# Patient Record
Sex: Male | Born: 1965 | Race: White | Hispanic: No | Marital: Married | Smoking: Never smoker
Health system: Southern US, Community
[De-identification: ages and names within clinical notes are randomized; demographics above are authoritative.]

## PROBLEM LIST (undated history)

## (undated) DIAGNOSIS — Z87442 Personal history of urinary calculi: Secondary | ICD-10-CM

## (undated) DIAGNOSIS — I10 Essential (primary) hypertension: Secondary | ICD-10-CM

## (undated) DIAGNOSIS — K219 Gastro-esophageal reflux disease without esophagitis: Secondary | ICD-10-CM

## (undated) HISTORY — PX: TUMOR REMOVAL: SHX12

## (undated) HISTORY — PX: HAND SURGERY: SHX662

## (undated) HISTORY — PX: THROAT SURGERY: SHX803

---

## 2005-04-26 ENCOUNTER — Emergency Department: Payer: Self-pay | Admitting: Emergency Medicine

## 2007-08-26 ENCOUNTER — Ambulatory Visit: Payer: Self-pay | Admitting: Urology

## 2008-07-10 ENCOUNTER — Ambulatory Visit: Payer: Self-pay | Admitting: Internal Medicine

## 2009-01-13 IMAGING — US US RENAL KIDNEY
1 series · 14 of 25 positions shown · non-contrast
Comparison: none

REASON FOR EXAM: UTI, history of left flank pain
COMMENTS:

[Series 1: us renal kidney · 0.39mm/px · 14 of 30 slices shown]
[im 1/30]
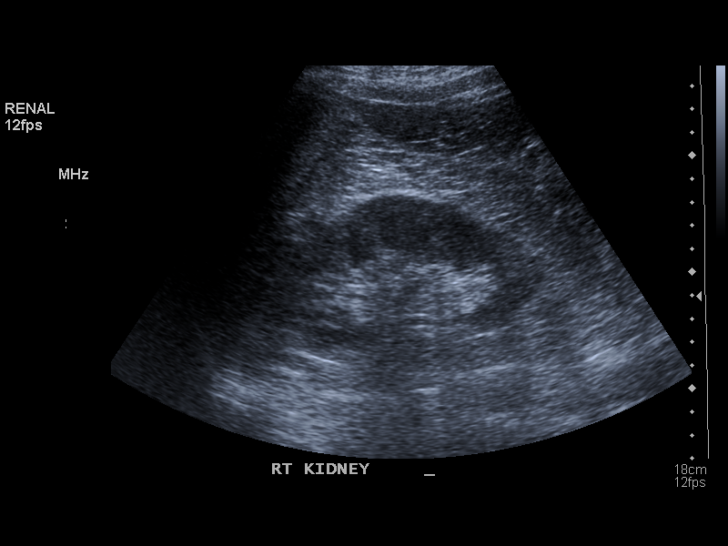
[im 3/30]
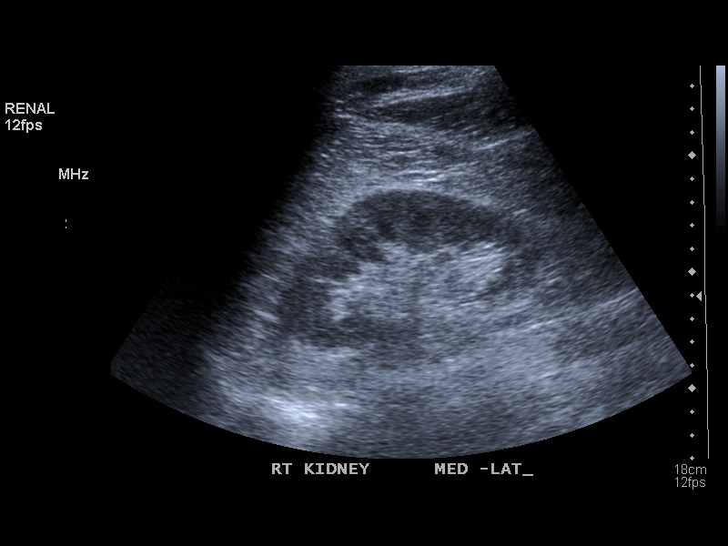
[im 5/30]
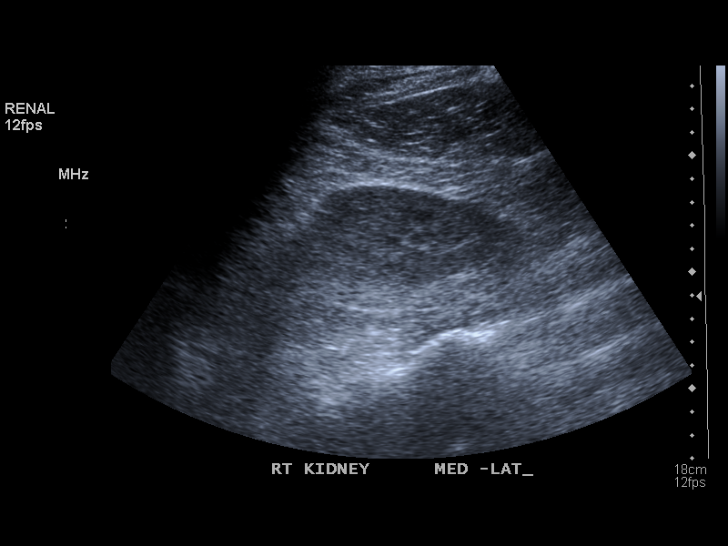
[im 8/30]
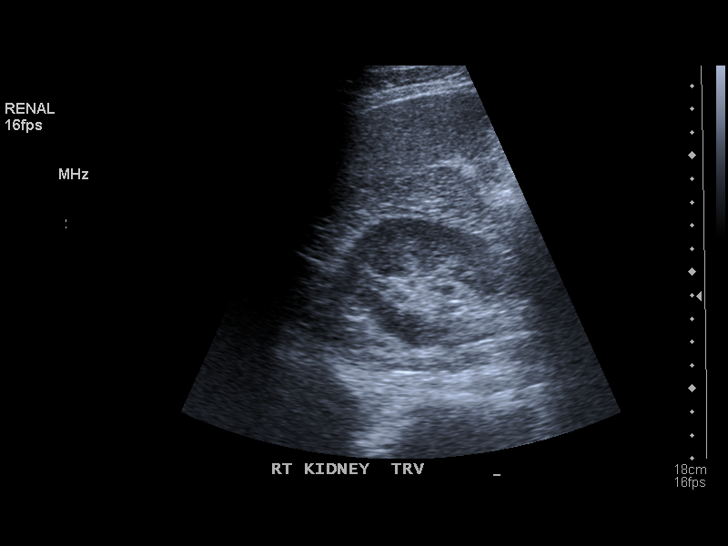
[im 10/30]
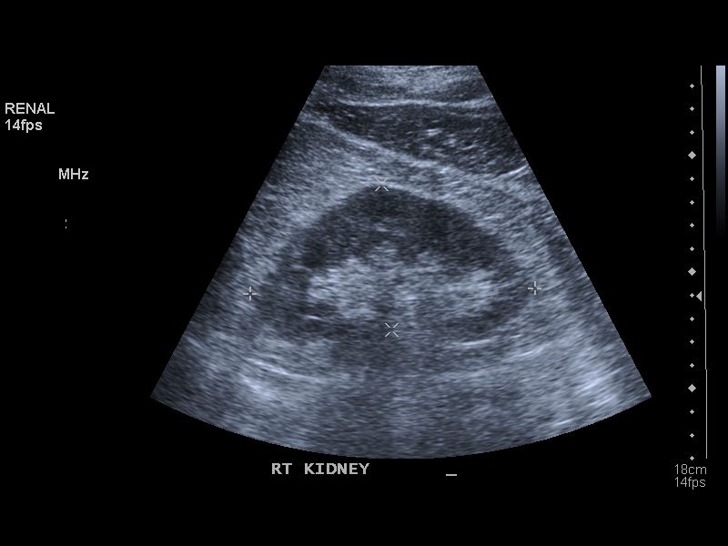
[im 11/30]
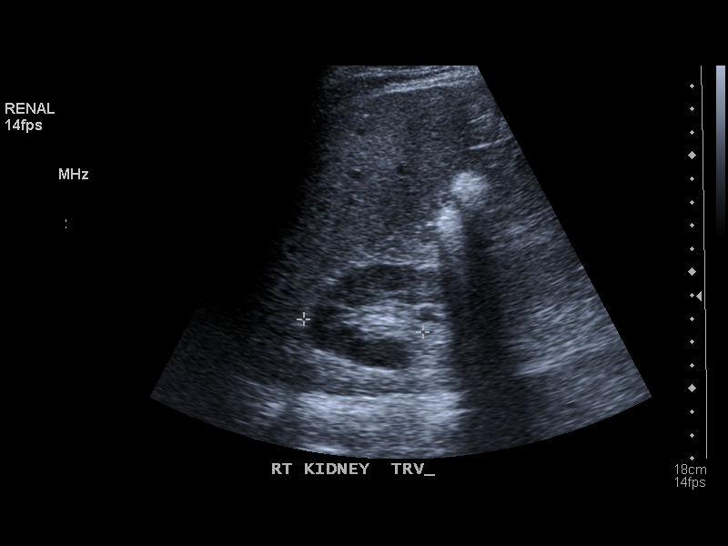
[im 14/30]
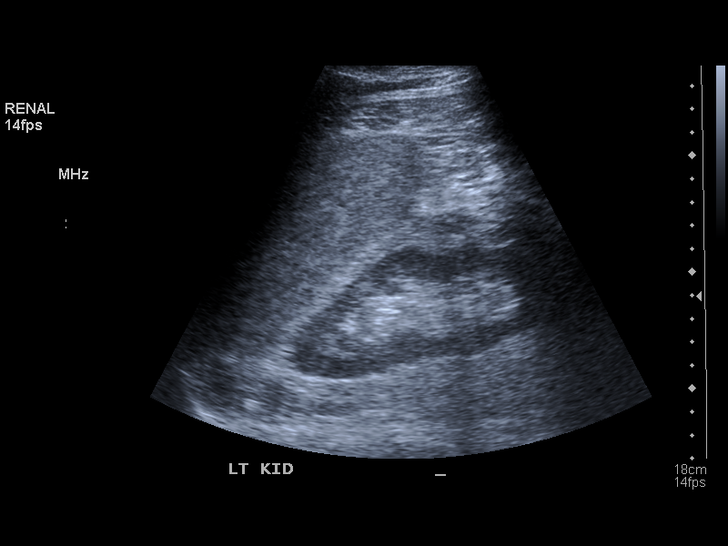
[im 16/30]
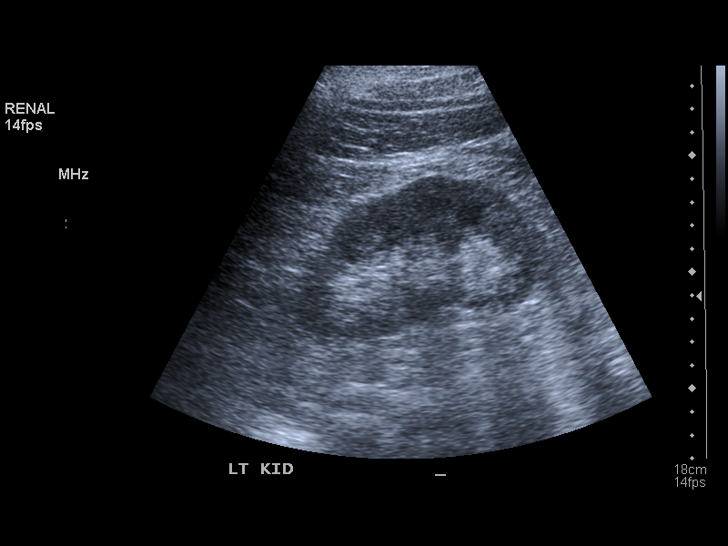
[im 19/30]
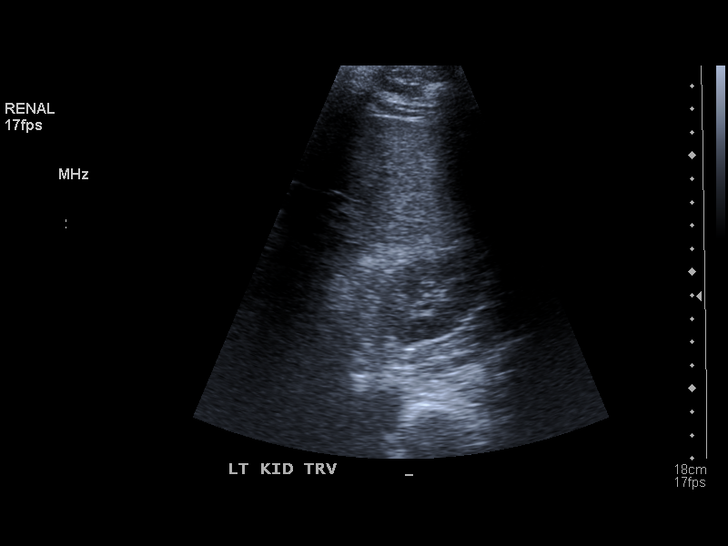
[im 20/30]
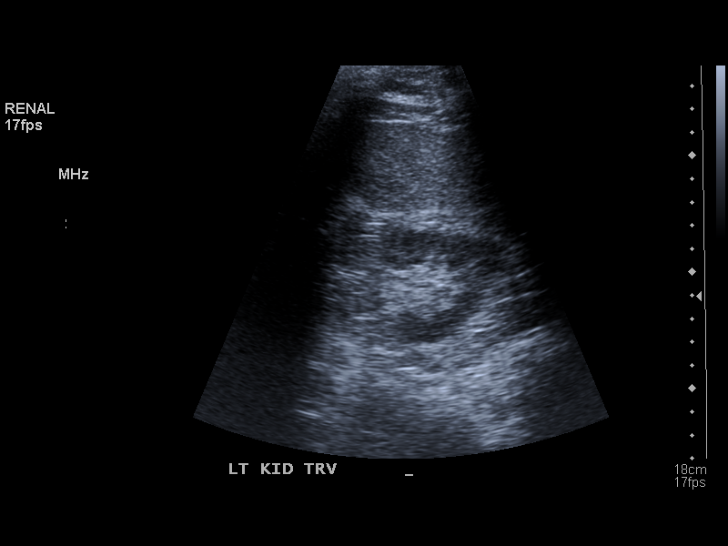
[im 22/30]
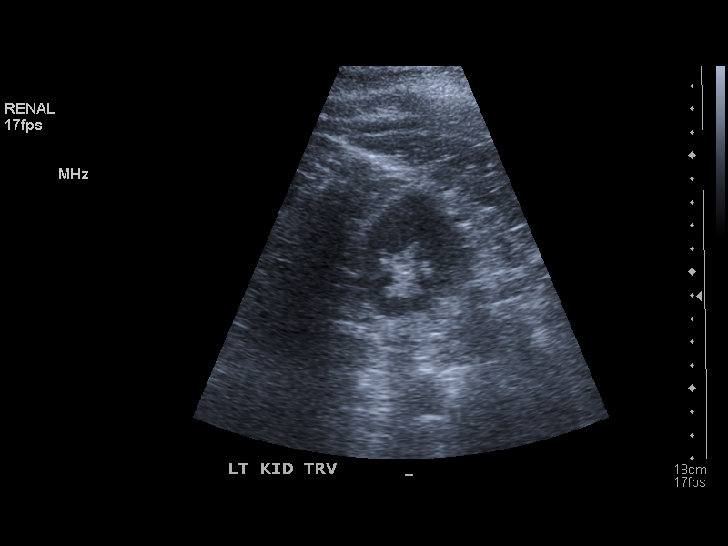
[im 25/30]
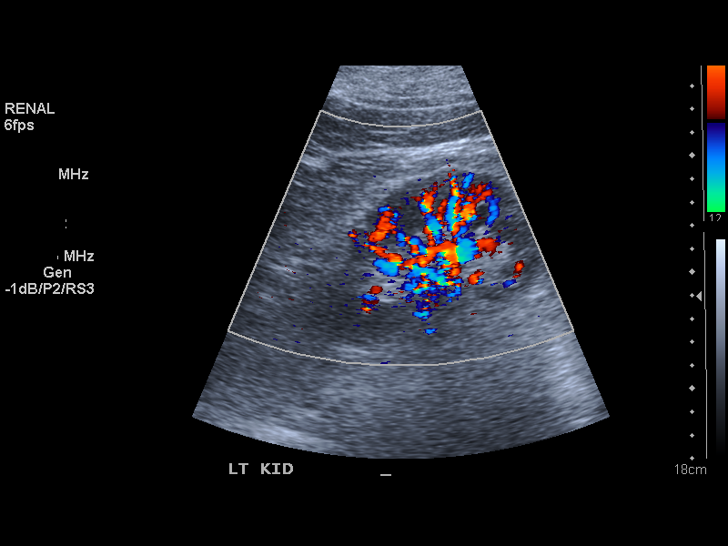
[im 27/30]
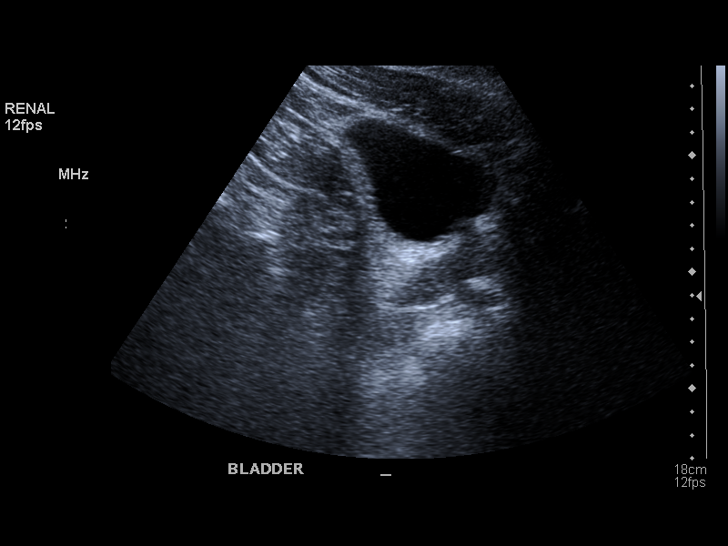
[im 30/30]
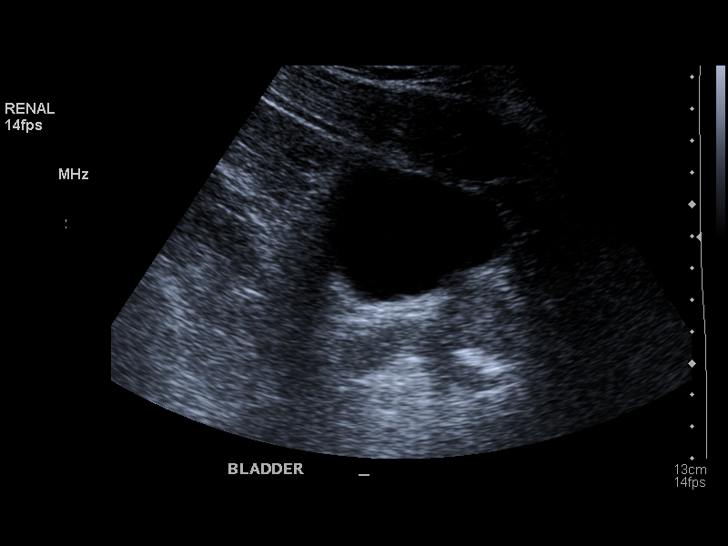

[14 of 25 positions shown; findings below may reference images not displayed]

PROCEDURE:     US  - US KIDNEY  - August 26, 2007 [DATE]

RESULT:     The RIGHT kidney measures 12.2 cm x 6.2 cm x 5.1 cm. The LEFT
kidney measures 11.97 cm x 5.6 cm x 5.18 cm. The renal cortical margins are
smooth. No renal mass lesions are seen. No renal calcifications are noted.
There is no hydronephrosis. The visualized portion of the urinary bladder is
normal in appearance.
IMPRESSION: No significant abnormalities are noted.

## 2010-02-13 ENCOUNTER — Ambulatory Visit: Payer: Self-pay | Admitting: Internal Medicine

## 2013-01-23 ENCOUNTER — Ambulatory Visit: Payer: Self-pay | Admitting: Unknown Physician Specialty

## 2016-07-13 NOTE — Patient Instructions (Signed)
  Your procedure is scheduled on: 07-20-16 (FRIDAY) Report to Same Day Surgery 2nd floor medical mall [facility](Medical Mall Entrance-take elevator on left to 2nd floor.  Check in with surgery information desk.) To find out your arrival time please call [phone] between 1PM - 3PM on 07-19-16 [facility](THURSDAY)  Remember: Instructions that are not followed completely may result in serious medical risk, up to and including death, or upon the discretion of your surgeon and anesthesiologist your surgery may need to be rescheduled.    _x___ 1. Do not eat food or drink liquids after midnight. No gum chewing or hard candies.     __x__ 2. No Alcohol for 24 hours before or after surgery.   __x__3. No Smoking for 24 prior to surgery.   ____  4. Bring all medications with you on the day of surgery if instructed.    __x__ 5. Notify your doctor if there is any change in your medical condition     (cold, fever, infections).     Do not wear jewelry, make-up, hairpins, clips or nail polish.  Do not wear lotions, powders, or perfumes. You may wear deodorant.  Do not shave 48 hours prior to surgery. Men may shave face and neck.  Do not bring valuables to the hospital.    [facility] not responsible for any belongings or valuables.               Contacts, dentures or bridgework may not be worn into surgery.  Leave your suitcase in the car. After surgery it may be brought to your room.  For patients admitted to the hospital, discharge time is determined by your treatment team.   Patients discharged the day of surgery will not be allowed to drive home.  You will need someone to drive you home and stay with you the night of your procedure.    Please read over the following fact sheets that you were given:   [facility] Preparing for Surgery and or MRSA Information   _x___ Take these medicines the morning of surgery with A SIP OF WATER:    1. DOXAZOSIN (CARDURA)  2. PRILOSEC  3.  4.  5.  6.  ____Fleets enema  or Magnesium Citrate as directed.   _x___ Use CHG Soap or sage wipes as directed on instruction sheet   ____ Use inhalers on the day of surgery and bring to hospital day of surgery  ____ Stop metformin 2 days prior to surgery    ____ Take 1/2 of usual insulin dose the night before surgery and none on the morning of  surgery.   ____ Stop Aspirin, Coumadin, Pllavix ,Eliquis, Effient, or Pradaxa  x__ Stop Anti-inflammatories such as Advil, Aleve, Ibuprofen, Motrin, Naproxen,          Naprosyn, Goodies powders or aspirin products NOW-Ok to take Tylenol.   _X___ Stop supplements until after surgery-PUMPKIN SEED OIL  ____ Bring C-Pap to the hospital.

## 2016-07-13 NOTE — Pre-Procedure Instructions (Signed)
Jerome Purl, MD - 06/28/2016 10:45 AM EST Formatting of this note may be different from the original. Jerome Arnold is a 51 y.o. male  CHIEF COMPLAINT: Chief Complaint  Patient presents with  . Hypertension   SUBJECTIVE: Patient going for hernia surgery, blood pressure elevated at 160/95, he rechecked it and is 140/85, 155/90 this morning. Overall asymptomatic, has not been working out and thinks that may be related. Compliant with his blood pressure medicine losartan. ______________________________________________________________________ Current Outpatient Prescriptions: acyclovir (ZOVIRAX) 400 MG tablet, Take 1 tablet (400 mg total) by mouth once daily., Taking cyclobenzaprine (FLEXERIL) 10 MG tablet, Take 10 mg by mouth once daily as needed for Muscle spasms., Taking doxazosin (CARDURA) 2 MG tablet, Take 2 mg by mouth once daily. EPINEPHrine (EPIPEN) 0.3 mg/0.3 mL pen injector, Inject 0.3 mg into the muscle once as needed for Anaphylaxis., Taking losartan-hydrochlorothiazide (HYZAAR) 100-12.5 mg tablet, Take 1 tablet by mouth once daily., Taking omeprazole (PRILOSEC) 20 MG DR capsule, Take 20 mg by mouth once daily., Taking  ALLERGIES: Guaifenesin and Nut - unspecified  Past Medical History:  Diagnosis Date  . Benign essential hypertension 07/22/2015  . 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 hand surgery Right  tendons repaired -- 8th grade  . Right knee tumor, benign  . throat surgery  3rd grade - sliced from a door and repaired  . TONSILLECTOMY   PHYSICAL EXAM:  BP (!) 142/100  Pulse 86  Ht 175.3 cm (5\' 9" )  Wt (!) 112.5 kg (248 lb)  SpO2 98%  BMI 36.62 kg/m Body mass index is 36.62 kg/m.  Wt Readings from Last 3 Encounters:  06/28/16 (!) 112.5 kg (248 lb)  06/20/16 (!) 113.4 kg (250 lb)  05/22/16 (!) 113.4 kg (250 lb)   BP Readings from Last 3 Encounters:  06/28/16 (!) 142/100  06/20/16  (!) 158/110  05/22/16 150/80   Constitutional:NAD Neck: supple, no thyromegaly, good ROM Respiratory:clear to auscultation, no rales or wheezes Cardiovascular:RRR, no murmur or gallop  ASSESSMENT/PLAN:  Uncontrolled hypertension-after he gets his hernia fixed he gets to working out he may not need the second blood pressure medicine but for now will add doxazosin 2 mg daily to the losartan, goal systolic less than 140, watch for orthostatic dizziness Right inguinal hernia- upcoming surgery 2/23 Mild cough-Claritin daily  No Follow-up on file.       Plan of Treatment - as of this encounter  Upcoming Encounters Upcoming Encounters  Date Type Specialty Care Team Description  07/16/2016 Ancillary Orders Lab Jerome Purl, MD  [address] [facility] [address]  [facility]  [place], [place] [address]  [phone]  [phone] (Fax)    07/23/2016 Office Visit Internal Medicine Jerome Purl, MD  [address] [facility] [address]  [facility]  [place], [place] [address]  [phone]  [phone] (Fax)    08/02/2016 Post Op General Surgery Mikel Cella., MD  [address] [place]  [place], [place] [address]  [phone]  [phone] (Fax)     Visit Diagnoses   Diagnosis  Benign essential hypertension - Primary  Essential hypertension, benign    Historical Medications - added in this encounter  This list may reflect changes made after this encounter.  Medication Sig. Disp. Refills Start Date End Date  doxazosin (CARDURA) 2 MG tablet  Take 2 mg by mouth once daily.       Images Document Information  Primary  Care Provider Jerome PurlMark Frederic Miller MD (Jan. 18, 2017 - Present) [phone] (Work) [phone] (Fax) [phone] [facility] [address] [facility] [place], [place] [address]  Document Coverage Dates Feb. 01, 2018  Custodian Organization [facility] [phone] (Work) [place], [place] [address]   Encounter Providers Jerome PurlMark Frederic Miller MD (Attending) [phone] (Work) [phone] (Fax) [phone] [facility] [address] [facility] [place], [place] [address]   Encounter Date Feb. 01, 2018

## 2016-07-16 ENCOUNTER — Encounter
Admission: RE | Admit: 2016-07-16 | Discharge: 2016-07-16 | Disposition: A | Discharge status: Home / Self Care | Payer: Managed Care, Other (non HMO) | Source: Ambulatory Visit | Attending: Surgery | Admitting: Surgery

## 2016-07-16 DIAGNOSIS — Z01810 Encounter for preprocedural cardiovascular examination: Secondary | ICD-10-CM | POA: Diagnosis present

## 2016-07-16 DIAGNOSIS — I10 Essential (primary) hypertension: Secondary | ICD-10-CM | POA: Diagnosis not present

## 2016-07-16 HISTORY — DX: Gastro-esophageal reflux disease without esophagitis: K21.9

## 2016-07-16 HISTORY — DX: Personal history of urinary calculi: Z87.442

## 2016-07-16 HISTORY — DX: Essential (primary) hypertension: I10

## 2016-07-19 MED ORDER — CEFAZOLIN SODIUM-DEXTROSE 2-4 GM/100ML-% IV SOLN
2.0000 g | Freq: Once | INTRAVENOUS | Status: AC
  Administered 2016-07-20: 2 g via INTRAVENOUS

## 2016-07-20 ENCOUNTER — Encounter
Admission: RE | Disposition: A | Discharge status: Home / Self Care | Payer: Self-pay | Source: Ambulatory Visit | Attending: Surgery

## 2016-07-20 ENCOUNTER — Ambulatory Visit
Admission: RE | Admit: 2016-07-20 | Discharge: 2016-07-20 | Disposition: A | Discharge status: Home / Self Care | Payer: Managed Care, Other (non HMO) | Source: Ambulatory Visit | Attending: Surgery | Admitting: Surgery

## 2016-07-20 ENCOUNTER — Ambulatory Visit: Payer: Managed Care, Other (non HMO) | Admitting: Anesthesiology

## 2016-07-20 ENCOUNTER — Encounter: Payer: Self-pay | Admitting: *Deleted

## 2016-07-20 DIAGNOSIS — K4090 Unilateral inguinal hernia, without obstruction or gangrene, not specified as recurrent: Secondary | ICD-10-CM | POA: Insufficient documentation

## 2016-07-20 DIAGNOSIS — I10 Essential (primary) hypertension: Secondary | ICD-10-CM | POA: Diagnosis not present

## 2016-07-20 DIAGNOSIS — Z87442 Personal history of urinary calculi: Secondary | ICD-10-CM | POA: Diagnosis not present

## 2016-07-20 DIAGNOSIS — Z888 Allergy status to other drugs, medicaments and biological substances status: Secondary | ICD-10-CM | POA: Diagnosis not present

## 2016-07-20 DIAGNOSIS — Z91010 Allergy to peanuts: Secondary | ICD-10-CM | POA: Insufficient documentation

## 2016-07-20 DIAGNOSIS — K219 Gastro-esophageal reflux disease without esophagitis: Secondary | ICD-10-CM | POA: Insufficient documentation

## 2016-07-20 HISTORY — PX: INGUINAL HERNIA REPAIR: SHX194

## 2016-07-20 SURGERY — REPAIR, HERNIA, INGUINAL, ADULT
Anesthesia: General | Site: Inguinal | Laterality: Left | Wound class: Clean

## 2016-07-20 MED ORDER — FENTANYL CITRATE (PF) 100 MCG/2ML IJ SOLN
25.0000 ug | INTRAMUSCULAR | Status: DC | PRN
  Administered 2016-07-20 (×2): 25 ug via INTRAVENOUS

## 2016-07-20 MED ORDER — LIDOCAINE HCL (CARDIAC) 20 MG/ML IV SOLN
INTRAVENOUS | Status: DC | PRN
  Administered 2016-07-20: 80 mg via INTRAVENOUS

## 2016-07-20 MED ORDER — LIDOCAINE HCL (PF) 2 % IJ SOLN
INTRAMUSCULAR | Status: AC
  Filled 2016-07-20: qty 2

## 2016-07-20 MED ORDER — KETOROLAC TROMETHAMINE 30 MG/ML IJ SOLN
INTRAMUSCULAR | Status: AC
  Filled 2016-07-20: qty 1

## 2016-07-20 MED ORDER — DEXAMETHASONE SODIUM PHOSPHATE 10 MG/ML IJ SOLN
INTRAMUSCULAR | Status: AC
  Filled 2016-07-20: qty 1

## 2016-07-20 MED ORDER — HYDROCODONE-ACETAMINOPHEN 5-325 MG PO TABS: 1.0000 | ORAL_TABLET | ORAL | 0 refills | Status: DC | PRN

## 2016-07-20 MED ORDER — PROMETHAZINE HCL 25 MG/ML IJ SOLN: 6.2500 mg | INTRAMUSCULAR | Status: DC | PRN

## 2016-07-20 MED ORDER — FENTANYL CITRATE (PF) 100 MCG/2ML IJ SOLN
INTRAMUSCULAR | Status: AC
  Administered 2016-07-20: 25 ug via INTRAVENOUS
  Filled 2016-07-20: qty 2

## 2016-07-20 MED ORDER — SUGAMMADEX SODIUM 200 MG/2ML IV SOLN
INTRAVENOUS | Status: DC | PRN
  Administered 2016-07-20: 200 mg via INTRAVENOUS

## 2016-07-20 MED ORDER — PROPOFOL 10 MG/ML IV BOLUS
INTRAVENOUS | Status: AC
  Filled 2016-07-20: qty 20

## 2016-07-20 MED ORDER — SUGAMMADEX SODIUM 200 MG/2ML IV SOLN
INTRAVENOUS | Status: AC
  Filled 2016-07-20: qty 2

## 2016-07-20 MED ORDER — ONDANSETRON HCL 4 MG/2ML IJ SOLN
INTRAMUSCULAR | Status: DC | PRN
  Administered 2016-07-20: 4 mg via INTRAVENOUS

## 2016-07-20 MED ORDER — FENTANYL CITRATE (PF) 100 MCG/2ML IJ SOLN
INTRAMUSCULAR | Status: AC
  Filled 2016-07-20: qty 2

## 2016-07-20 MED ORDER — FENTANYL CITRATE (PF) 100 MCG/2ML IJ SOLN
INTRAMUSCULAR | Status: DC | PRN
  Administered 2016-07-20: 100 ug via INTRAVENOUS

## 2016-07-20 MED ORDER — MIDAZOLAM HCL 2 MG/2ML IJ SOLN
INTRAMUSCULAR | Status: AC
  Filled 2016-07-20: qty 2

## 2016-07-20 MED ORDER — BUPIVACAINE-EPINEPHRINE (PF) 0.5% -1:200000 IJ SOLN
INTRAMUSCULAR | Status: DC | PRN
  Administered 2016-07-20: 17 mL via PERINEURAL

## 2016-07-20 MED ORDER — CEFAZOLIN SODIUM-DEXTROSE 2-4 GM/100ML-% IV SOLN
INTRAVENOUS | Status: AC
  Administered 2016-07-20: 2 g via INTRAVENOUS
  Filled 2016-07-20: qty 100

## 2016-07-20 MED ORDER — DEXAMETHASONE SODIUM PHOSPHATE 10 MG/ML IJ SOLN
INTRAMUSCULAR | Status: DC | PRN
  Administered 2016-07-20: 10 mg via INTRAVENOUS

## 2016-07-20 MED ORDER — PROPOFOL 10 MG/ML IV BOLUS
INTRAVENOUS | Status: DC | PRN
  Administered 2016-07-20: 200 mg via INTRAVENOUS

## 2016-07-20 MED ORDER — ROCURONIUM BROMIDE 100 MG/10ML IV SOLN
INTRAVENOUS | Status: DC | PRN
  Administered 2016-07-20: 50 mg via INTRAVENOUS
  Administered 2016-07-20: 20 mg via INTRAVENOUS

## 2016-07-20 MED ORDER — ONDANSETRON HCL 4 MG/2ML IJ SOLN
INTRAMUSCULAR | Status: AC
  Filled 2016-07-20: qty 2

## 2016-07-20 MED ORDER — ROCURONIUM BROMIDE 50 MG/5ML IV SOLN
INTRAVENOUS | Status: AC
  Filled 2016-07-20: qty 1

## 2016-07-20 MED ORDER — HYDROCODONE-ACETAMINOPHEN 5-325 MG PO TABS
1.0000 | ORAL_TABLET | ORAL | Status: DC | PRN
Start: 2016-07-20 — End: 2016-07-20

## 2016-07-20 MED ORDER — MIDAZOLAM HCL 2 MG/2ML IJ SOLN
INTRAMUSCULAR | Status: DC | PRN
  Administered 2016-07-20: 2 mg via INTRAVENOUS

## 2016-07-20 MED ORDER — LACTATED RINGERS IV SOLN
INTRAVENOUS | Status: DC
  Administered 2016-07-20: 08:00:00 via INTRAVENOUS

## 2016-07-20 MED ORDER — BUPIVACAINE-EPINEPHRINE (PF) 0.5% -1:200000 IJ SOLN
INTRAMUSCULAR | Status: AC
  Filled 2016-07-20: qty 30

## 2016-07-20 MED ORDER — KETOROLAC TROMETHAMINE 30 MG/ML IJ SOLN
INTRAMUSCULAR | Status: DC | PRN
  Administered 2016-07-20: 30 mg via INTRAVENOUS

## 2016-07-20 SURGICAL SUPPLY — 29 items
BLADE CLIPPER SURG (BLADE) ×3 IMPLANT
BLADE SURG 15 STRL LF DISP TIS (BLADE) ×1 IMPLANT
BLADE SURG 15 STRL SS (BLADE) ×2
CANISTER SUCT 1200ML W/VALVE (MISCELLANEOUS) ×3 IMPLANT
CHLORAPREP W/TINT 26ML (MISCELLANEOUS) ×3 IMPLANT
DERMABOND ADVANCED (GAUZE/BANDAGES/DRESSINGS) ×2
DERMABOND ADVANCED .7 DNX12 (GAUZE/BANDAGES/DRESSINGS) ×1 IMPLANT
DRAIN PENROSE 5/8X18 LTX STRL (WOUND CARE) ×3 IMPLANT
DRAPE LAPAROTOMY 77X122 PED (DRAPES) ×3 IMPLANT
ELECT REM PT RETURN 9FT ADLT (ELECTROSURGICAL) ×3
ELECTRODE REM PT RTRN 9FT ADLT (ELECTROSURGICAL) ×1 IMPLANT
GLOVE BIO SURGEON STRL SZ7.5 (GLOVE) ×12 IMPLANT
GLOVE BIOGEL PI IND STRL 7.5 (GLOVE) ×5 IMPLANT
GLOVE BIOGEL PI INDICATOR 7.5 (GLOVE) ×10
GOWN STRL REUS W/ TWL LRG LVL3 (GOWN DISPOSABLE) ×4 IMPLANT
GOWN STRL REUS W/TWL LRG LVL3 (GOWN DISPOSABLE) ×8
KIT RM TURNOVER STRD PROC AR (KITS) ×3 IMPLANT
LABEL OR SOLS (LABEL) ×3 IMPLANT
MESH SYNTHETIC 4X6 SOFT BARD (Mesh General) ×1 IMPLANT
MESH SYNTHETIC SOFT BARD 4X6 (Mesh General) ×2 IMPLANT
NEEDLE HYPO 25X1 1.5 SAFETY (NEEDLE) ×3 IMPLANT
NS IRRIG 500ML POUR BTL (IV SOLUTION) ×3 IMPLANT
PACK BASIN MINOR ARMC (MISCELLANEOUS) ×3 IMPLANT
SUT CHROMIC 4 0 RB 1X27 (SUTURE) ×3 IMPLANT
SUT MNCRL AB 4-0 PS2 18 (SUTURE) ×3 IMPLANT
SUT SURGILON 0 30 BLK (SUTURE) ×9 IMPLANT
SUT VIC AB 4-0 SH 27 (SUTURE) ×2
SUT VIC AB 4-0 SH 27XANBCTRL (SUTURE) ×1 IMPLANT
SYRINGE 10CC LL (SYRINGE) ×3 IMPLANT

## 2016-07-20 NOTE — Anesthesia Postprocedure Evaluation (Signed)
Anesthesia Post Note  Patient: Jerome Arnold  Procedure(s) Performed: Procedure(s) (LRB): HERNIA REPAIR INGUINAL ADULT (Left)  Patient location during evaluation: PACU Anesthesia Type: General Level of consciousness: awake and alert Pain management: pain level controlled Vital Signs Assessment: post-procedure vital signs reviewed and stable Respiratory status: spontaneous breathing, nonlabored ventilation, respiratory function stable and patient connected to nasal cannula oxygen Cardiovascular status: blood pressure returned to baseline and stable Postop Assessment: no signs of nausea or vomiting Anesthetic complications: no     Last Vitals:  Vitals:   07/20/16 1139 07/20/16 1148  BP: (!) 115/59 (!) 101/56  Pulse: 62 (!) 52  Resp: 14   Temp: 36.8 C (!) 35.9 C    Last Pain:  Vitals:   07/20/16 1148  TempSrc: Temporal  PainSc:                  Lenard SimmerAndrew Lariya Kinzie

## 2016-07-20 NOTE — OR Nursing (Signed)
Upon arrival to post-op, pt c/o feeling like he needed to have a bowel movement, transferred to bedside commode.  Also C/O nausea, 1st bag of IVF up 300 ml left to infuse, turned wide open.

## 2016-07-20 NOTE — OR Nursing (Signed)
Dr. Katrinka BlazingSmith into see pt and wife, ok to dc home.

## 2016-07-20 NOTE — Op Note (Signed)
OPERATIVE REPORT  PREOPERATIVE DIAGNOSIS: left inguinal hernia  POSTOPERATIVE DIAGNOSIS:left  inguinal hernia  PROCEDURE:  left inguinal hernia repair  ANESTHESIA:  General  SURGEON:  Renda RollsWilton Symone Cornman M.D.  INDICATIONS: He reports bulging in the left groin with moderate discomfort. A left inguinal hernia was demonstrated on physical exam and repair was recommended for definitive treatment.  With the patient on the operating table in the supine position the left lower quadrant was prepared with clippers and with ChloraPrep and draped in a sterile manner. A transversely oriented suprapubic incision was made and carried down through subcutaneous tissues. Electrocautery was used for hemostasis. The Scarpa's fascia was incised. The external oblique aponeurosis was incised along the course of its fibers to open the external ring and expose the inguinal cord structures. The cord structures were mobilized. A Penrose drain was passed around the cord structures for traction. The floor of the inguinal canal was intact. Cremaster fibers were separated to expose the herniated tissues consisting of sigmoid colon and omentum. Dissection was carried out so that the colon could be put back into the abdomen. A portion of the omentum was ligated with 0 Surgilon and amputated and was not submitted for pathology. The repair was carried out with 0 Surgilon sutures suturing the conjoined tendon to the shelving edge of the inguinal ligament incorporating transversalis fascia into the repair. The last stitch led to satisfactory narrowing of the internal ring.  Bard soft mesh was cut to create an oval shape and was placed over the repair. This was sutured to the repair with interrupted 0 Surgilon sutures and also sutured medially to the deep fascia and on both sides of the internal ring. Next after seeing hemostasis was intact the cord structures were replaced along the floor of the inguinal canal. The cut edges of the external  oblique aponeurosis were closed with a running 4-0 Vicryl suture to re-create the external ring. The deep fascia superior and lateral to the repair site was infiltrated with half percent Sensorcaine with epinephrine. Subcutaneous tissues were also infiltrated. The Scarpa's fascia was closed with interrupted 4-0 Vicryl sutures. The skin was closed with running 4-0 Monocryl subcuticular suture and Dermabond. The testicle remained in the scrotum  The patient appeared to be in satisfactory condition and was prepared for transfer to the recovery room.  Renda RollsWilton Jerret Mcbane M.D.

## 2016-07-20 NOTE — Anesthesia Post-op Follow-up Note (Cosign Needed)
Anesthesia QCDR form completed.        

## 2016-07-20 NOTE — Anesthesia Preprocedure Evaluation (Signed)
Anesthesia Evaluation  Patient identified by MRN, date of birth, ID band Patient awake    Reviewed: Allergy & Precautions, H&P , NPO status , Patient's Chart, lab work & pertinent test results, reviewed documented beta blocker date and time   History of Anesthesia Complications Negative for: history of anesthetic complications  Airway Mallampati: II  TM Distance: >3 FB Neck ROM: full    Dental  (+) Teeth Intact   Pulmonary neg pulmonary ROS,           Cardiovascular Exercise Tolerance: Good hypertension, (-) angina(-) CAD, (-) Past MI, (-) Cardiac Stents and (-) CABG (-) dysrhythmias (-) Valvular Problems/Murmurs     Neuro/Psych negative neurological ROS  negative psych ROS   GI/Hepatic Neg liver ROS, GERD  Medicated,  Endo/Other  negative endocrine ROS  Renal/GU Renal disease (kidney stones)  negative genitourinary   Musculoskeletal   Abdominal   Peds  Hematology negative hematology ROS (+)   Anesthesia Other Findings Past Medical History: No date: GERD (gastroesophageal reflux disease) No date: History of kidney stones No date: Hypertension   Reproductive/Obstetrics negative OB ROS                             Anesthesia Physical Anesthesia Plan  ASA: II  Anesthesia Plan: General   Post-op Pain Management:    Induction:   Airway Management Planned:   Additional Equipment:   Intra-op Plan:   Post-operative Plan:   Informed Consent: I have reviewed the patients History and Physical, chart, labs and discussed the procedure including the risks, benefits and alternatives for the proposed anesthesia with the patient or authorized representative who has indicated his/her understanding and acceptance.   Dental Advisory Given  Plan Discussed with: Anesthesiologist, CRNA and Surgeon  Anesthesia Plan Comments:         Anesthesia Quick Evaluation

## 2016-07-20 NOTE — H&P (Signed)
  He comes in today prepared for left inguinal hernia repair.  He reports no change in condition since the office visit.  The left side was marked YES. I discussed the plan for

## 2016-07-20 NOTE — Transfer of Care (Signed)
Immediate Anesthesia Transfer of Care Note  Patient: Jerome Arnold  Procedure(s) Performed: Procedure(s): HERNIA REPAIR INGUINAL ADULT (Left)  Patient Location: PACU  Anesthesia Type:General  Level of Consciousness: sedated  Airway & Oxygen Therapy: Patient Spontanous Breathing  Post-op Assessment: Report given to RN and Post -op Vital signs reviewed and stable  Post vital signs: Reviewed and stable  Last Vitals:  Vitals:   07/20/16 0811 07/20/16 1044  BP: (!) 150/87 (!) 151/89  Pulse: 67 79  Resp: 20 18  Temp: (!) 36.1 C 36.8 C    Last Pain:  Vitals:   07/20/16 0811  TempSrc: Tympanic  PainSc: 0-No pain      Patients Stated Pain Goal: 2 (07/20/16 16100811)  Complications: No apparent anesthesia complications

## 2016-07-20 NOTE — Anesthesia Procedure Notes (Signed)
Procedure Name: Intubation Performed by: Letitia Neri Pre-anesthesia Checklist: Patient identified, Emergency Drugs available, Suction available, Patient being monitored and Timeout performed Patient Re-evaluated:Patient Re-evaluated prior to inductionOxygen Delivery Method: Circle system utilized Preoxygenation: Pre-oxygenation with 100% oxygen Intubation Type: IV induction Ventilation: Mask ventilation without difficulty Laryngoscope Size: Mac and 3 Grade View: Grade I Tube type: Oral Tube size: 7.0 mm Number of attempts: 1 Airway Equipment and Method: Stylet Placement Confirmation: ETT inserted through vocal cords under direct vision,  positive ETCO2 and breath sounds checked- equal and bilateral Secured at: 22 cm Tube secured with: Tape

## 2016-07-20 NOTE — Discharge Instructions (Addendum)
Take Tylenol or Norco if needed for pain.  Should not drive or do anything dangerous when taking Norco.  May shower.  Avoid straining and heavy lifting.   AMBULATORY SURGERY  DISCHARGE INSTRUCTIONS   1) The drugs that you were given will stay in your system until tomorrow so for the next 24 hours you should not:  A) Drive an automobile B) Make any legal decisions C) Drink any alcoholic beverage   2) You may resume regular meals tomorrow.  Today it is better to start with liquids and gradually work up to solid foods.  You may eat anything you prefer, but it is better to start with liquids, then soup and crackers, and gradually work up to solid foods.   3) Please notify your doctor immediately if you have any unusual bleeding, trouble breathing, redness and pain at the surgery site, drainage, fever, or pain not relieved by medication.   4) Additional Instructions:

## 2016-08-28 ENCOUNTER — Ambulatory Visit
Admission: RE | Admit: 2016-08-28 | Discharge: 2016-08-28 | Disposition: A | Discharge status: Home / Self Care | Payer: Managed Care, Other (non HMO) | Source: Ambulatory Visit | Attending: Physician Assistant | Admitting: Physician Assistant

## 2016-08-28 ENCOUNTER — Other Ambulatory Visit: Payer: Self-pay | Admitting: Physician Assistant

## 2016-08-28 DIAGNOSIS — I82492 Acute embolism and thrombosis of other specified deep vein of left lower extremity: Secondary | ICD-10-CM | POA: Insufficient documentation

## 2016-08-28 DIAGNOSIS — R600 Localized edema: Secondary | ICD-10-CM | POA: Diagnosis present

## 2016-08-28 DIAGNOSIS — M79605 Pain in left leg: Secondary | ICD-10-CM

## 2017-10-16 ENCOUNTER — Other Ambulatory Visit: Payer: Self-pay

## 2017-10-16 ENCOUNTER — Ambulatory Visit
Admission: EM | Admit: 2017-10-16 | Discharge: 2017-10-16 | Disposition: A | Discharge status: Home / Self Care | Payer: 59 | Attending: Family Medicine | Admitting: Family Medicine

## 2017-10-16 ENCOUNTER — Encounter: Payer: Self-pay | Admitting: Emergency Medicine

## 2017-10-16 DIAGNOSIS — J0100 Acute maxillary sinusitis, unspecified: Secondary | ICD-10-CM

## 2017-10-16 MED ORDER — HYDROCOD POLST-CPM POLST ER 10-8 MG/5ML PO SUER: 5.0000 mL | Freq: Every evening | ORAL | 0 refills | Status: DC | PRN

## 2017-10-16 MED ORDER — AMOXICILLIN 875 MG PO TABS: 875.0000 mg | ORAL_TABLET | Freq: Two times a day (BID) | ORAL | 0 refills | Status: DC

## 2017-10-16 NOTE — Discharge Instructions (Addendum)
Flonase nasal spray °

## 2017-10-16 NOTE — ED Provider Notes (Signed)
[facility]    CSN: [id number] Arrival date & time: 10/16/17  0813     History   Chief Complaint Chief Complaint  Patient presents with  . Nasal Congestion    HPI Jerome Arnold is a 52 y.o. male.   The history is provided by the patient.  URI  Presenting symptoms: congestion, ear pain, facial pain and fatigue   Severity:  Moderate Onset quality:  Sudden Duration:  1 week Timing:  Constant Progression:  Worsening Chronicity:  New Relieved by:  Nothing Ineffective treatments:  OTC medications Associated symptoms: sinus pain   Associated symptoms: no wheezing   Risk factors: not elderly, no chronic cardiac disease, no chronic kidney disease, no chronic respiratory disease, no diabetes mellitus, no immunosuppression, no recent illness and no recent travel     Past Medical History:  Diagnosis Date  . GERD (gastroesophageal reflux disease)   . History of kidney stones   . Hypertension     There are no active problems to display for this patient.   Past Surgical History:  Procedure Laterality Date  . INGUINAL HERNIA REPAIR Left 07/20/2016   Procedure: HERNIA REPAIR INGUINAL ADULT;  Surgeon: Nadeen Landau, MD;  Location: [facility] ORS;  Service: General;  Laterality: Left;       Home Medications    Prior to Admission medications   Medication Sig Start Date End Date Taking? Authorizing Provider  acyclovir (ZOVIRAX) 400 MG tablet Take 400 mg by mouth daily.   Yes [provider]  doxazosin (CARDURA) 2 MG tablet Take 2 mg by mouth every morning.    Yes [provider]  Misc Natural Products (PUMPKIN SEED OIL PO) Take 1 capsule by mouth daily. 1000 mg   Yes [provider]  omeprazole (PRILOSEC) 20 MG capsule Take 20 mg by mouth every evening.   Yes [provider]  telmisartan-hydrochlorothiazide (MICARDIS HCT) 80-12.5 MG tablet Take 1 tablet by mouth daily. 09/25/17  Yes [provider]  amoxicillin (AMOXIL)  875 MG tablet Take 1 tablet (875 mg total) by mouth 2 (two) times daily. 10/16/17   Payton Mccallum, MD  chlorpheniramine-HYDROcodone (TUSSIONEX PENNKINETIC ER) 10-8 MG/5ML SUER Take 5 mLs by mouth at bedtime as needed. 10/16/17   Payton Mccallum, MD  HYDROcodone-acetaminophen (NORCO) 5-325 MG tablet Take 1-2 tablets by mouth every 4 (four) hours as needed for moderate pain. 07/20/16   Nadeen Landau, MD  losartan-hydrochlorothiazide [facility]) 100-12.5 MG tablet Take 1 tablet by mouth daily.    [provider]    Family History Family History  Problem Relation Age of Onset  . Brain cancer Mother   . Hypertension Father     Social History Social History   Tobacco Use  . Smoking status: Never Smoker  . Smokeless tobacco: Never Used  Substance Use Topics  . Alcohol use: Yes    Comment: BEER RARE  . Drug use: No     Allergies   Guaifenesin & derivatives and Peanut-containing drug products   Review of Systems Review of Systems  Constitutional: Positive for fatigue.  HENT: Positive for congestion, ear pain and sinus pain.   Respiratory: Negative for wheezing.      Physical Exam Triage Vital Signs ED Triage Vitals [10/16/17 0826]  Enc Vitals Group     BP (!) 177/112     Pulse Rate 81     Resp 16     Temp 98.2 F (36.8 C)     Temp Source Oral  SpO2 98 %     Weight 244 lb (110.7 kg)     Height  (1.753 m)     Head Circumference      Peak Flow      Pain Score 0     Pain Loc      Pain Edu?      Excl. in GC?    No data found.  Updated Vital Signs BP (!) 163/106 (BP Location: Right Arm) Comment: lg cuff  Pulse 81   Temp 98.2 F (36.8 C) (Oral)   Resp 16   Ht  (1.753 m)   Wt 244 lb (110.7 kg)   SpO2 98%   BMI 36.03 kg/m   Visual Acuity Right Eye Distance:   Left Eye Distance:   Bilateral Distance:    Right Eye Near:   Left Eye Near:    Bilateral Near:     Physical Exam  Constitutional: He appears well-developed and  well-nourished. No distress.  HENT:  Head: Normocephalic and atraumatic.  Right Ear: Tympanic membrane, external ear and ear canal normal.  Left Ear: Tympanic membrane, external ear and ear canal normal.  Nose: Right sinus exhibits maxillary sinus tenderness and frontal sinus tenderness. Left sinus exhibits maxillary sinus tenderness and frontal sinus tenderness.  Mouth/Throat: Uvula is midline, oropharynx is clear and moist and mucous membranes are normal. No oropharyngeal exudate or tonsillar abscesses.  Eyes: Conjunctivae are normal. Right eye exhibits no discharge. Left eye exhibits no discharge. No scleral icterus.  Neck: Normal range of motion. Neck supple. No tracheal deviation present. No thyromegaly present.  Cardiovascular: Normal rate, regular rhythm and normal heart sounds.  Pulmonary/Chest: Effort normal and breath sounds normal. No stridor. No respiratory distress. He has no wheezes. He has no rales. He exhibits no tenderness.  Lymphadenopathy:    He has no cervical adenopathy.  Neurological: He is alert.  Skin: Skin is warm and dry. No rash noted. He is not diaphoretic.  Nursing note and vitals reviewed.    UC Treatments / Results  Labs (all labs ordered are listed, but only abnormal results are displayed) Labs Reviewed - No data to display  EKG None  Radiology No results found.  Procedures Procedures (including critical care time)  Medications Ordered in UC Medications - No data to display  Initial Impression / Assessment and Plan / UC Course  I have reviewed the triage vital signs and the nursing notes.  Pertinent labs & imaging results that were available during my care of the patient were reviewed by me and considered in my medical decision making (see chart for details).      Final Clinical Impressions(s) / UC Diagnoses   Final diagnoses:  Acute maxillary sinusitis, recurrence not specified     Discharge Instructions     Flonase nasal  spray     ED Prescriptions    Medication Sig Dispense Auth. Provider   amoxicillin (AMOXIL) 875 MG tablet Take 1 tablet (875 mg total) by mouth 2 (two) times daily. 20 tablet Payton Mccallum, MD   chlorpheniramine-HYDROcodone (TUSSIONEX PENNKINETIC ER) 10-8 MG/5ML SUER Take 5 mLs by mouth at bedtime as needed. 60 mL Payton Mccallum, MD     1. diagnosis reviewed with patient 2. rx as per orders above; reviewed possible side effects, interactions, risks and benefits  3. Recommend supportive treatment with otc antihistamines, flonase 4. Follow-up prn if symptoms worsen or don't improve  Controlled Substance Prescriptions [place] Controlled Substance Registry consulted? Not Applicable  Payton Mccallum, MD 10/16/17 [phone]

## 2017-10-16 NOTE — ED Triage Notes (Signed)
Patient in today c/o 1 week history of nasal congestion, pressure and ear fullness. Patient has felt feverish, but hasn't taken his temperature. Patient has tried OTC Afrin without relief.

## 2018-01-20 ENCOUNTER — Other Ambulatory Visit: Payer: Self-pay

## 2018-01-20 ENCOUNTER — Encounter: Payer: Self-pay | Admitting: Emergency Medicine

## 2018-01-20 ENCOUNTER — Emergency Department
Admission: EM | Admit: 2018-01-20 | Discharge: 2018-01-20 | Disposition: A | Discharge status: Home / Self Care | Payer: 59 | Attending: Emergency Medicine | Admitting: Emergency Medicine

## 2018-01-20 DIAGNOSIS — Y999 Unspecified external cause status: Secondary | ICD-10-CM | POA: Insufficient documentation

## 2018-01-20 DIAGNOSIS — Y92410 Unspecified street and highway as the place of occurrence of the external cause: Secondary | ICD-10-CM | POA: Diagnosis not present

## 2018-01-20 DIAGNOSIS — S39012A Strain of muscle, fascia and tendon of lower back, initial encounter: Secondary | ICD-10-CM

## 2018-01-20 DIAGNOSIS — Z79899 Other long term (current) drug therapy: Secondary | ICD-10-CM | POA: Insufficient documentation

## 2018-01-20 DIAGNOSIS — Z91010 Allergy to peanuts: Secondary | ICD-10-CM | POA: Insufficient documentation

## 2018-01-20 DIAGNOSIS — Y9389 Activity, other specified: Secondary | ICD-10-CM | POA: Insufficient documentation

## 2018-01-20 DIAGNOSIS — I10 Essential (primary) hypertension: Secondary | ICD-10-CM | POA: Insufficient documentation

## 2018-01-20 DIAGNOSIS — S161XXA Strain of muscle, fascia and tendon at neck level, initial encounter: Secondary | ICD-10-CM | POA: Insufficient documentation

## 2018-01-20 DIAGNOSIS — S199XXA Unspecified injury of neck, initial encounter: Secondary | ICD-10-CM | POA: Diagnosis present

## 2018-01-20 MED ORDER — IBUPROFEN 800 MG PO TABS: 800.0000 mg | ORAL_TABLET | Freq: Three times a day (TID) | ORAL | 0 refills | Status: DC | PRN

## 2018-01-20 MED ORDER — CYCLOBENZAPRINE HCL 5 MG PO TABS: 5.0000 mg | ORAL_TABLET | Freq: Three times a day (TID) | ORAL | 0 refills | Status: DC | PRN
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
## 2018-01-20 NOTE — Discharge Instructions (Addendum)
Please take medications as prescribed.  Use ibuprofen and Flexeril as needed for pain and muscle spasms.  If any worsening pain or urgent changes in your health please return to the emergency department.

## 2018-09-10 IMAGING — US US EXTREM LOW VENOUS*L*
1 series · 13 of 24 positions shown · non-contrast
Comparison: None.

CLINICAL DATA: Edema.



[Series 1: us extrem low venous*left* · 0.11mm/px · 13 of 40 slices shown]
[im 1/40]
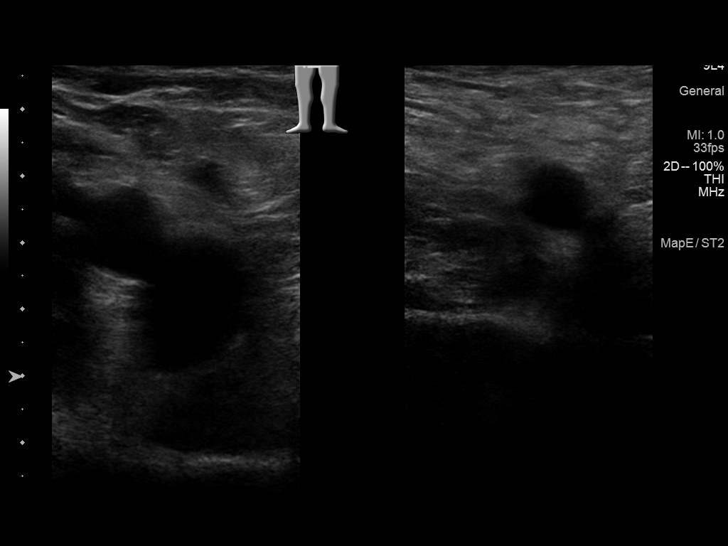
[im 4/40]
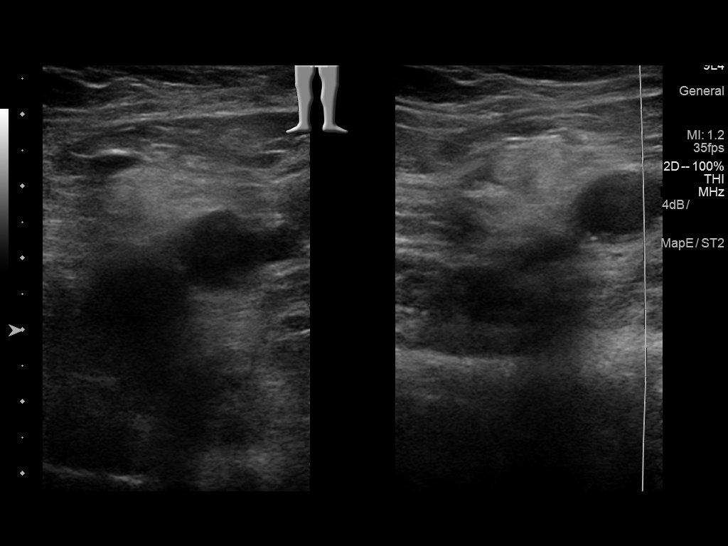
[im 7/40]
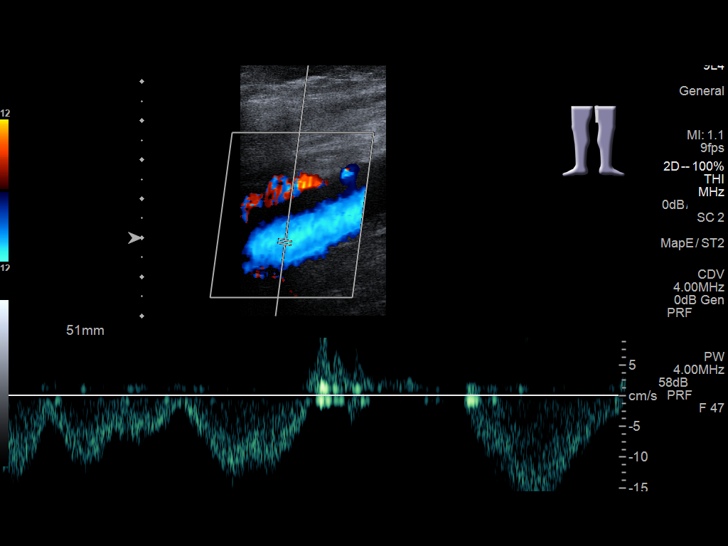
[im 11/40]
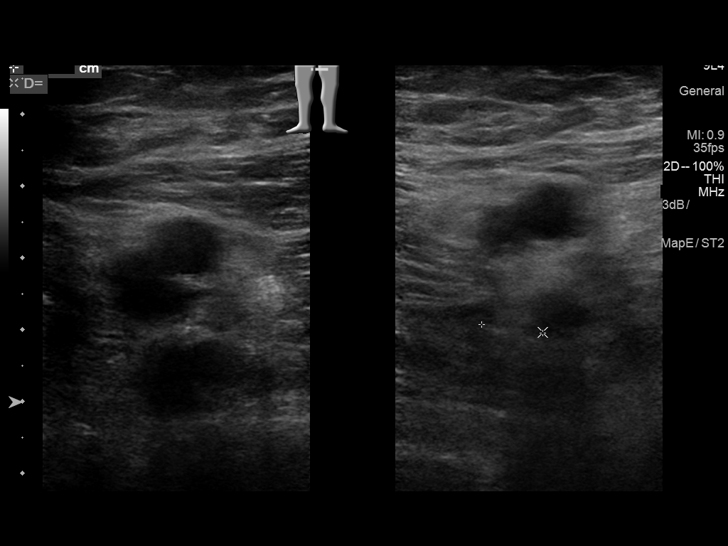
[im 14/40]
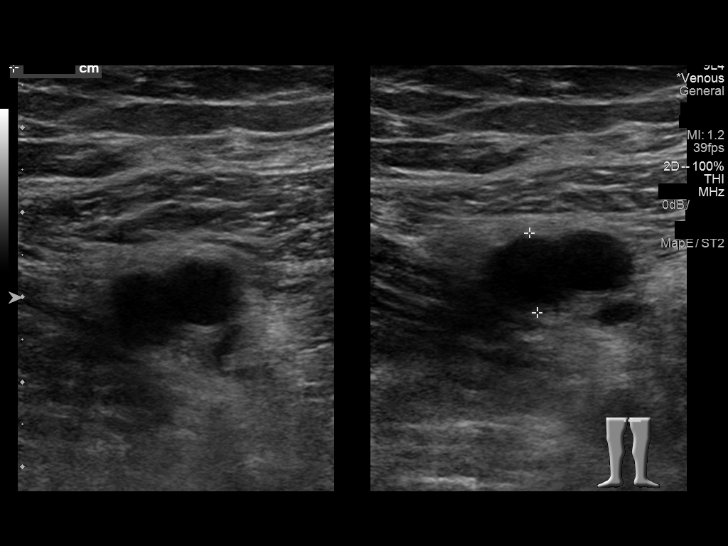
[im 17/40]
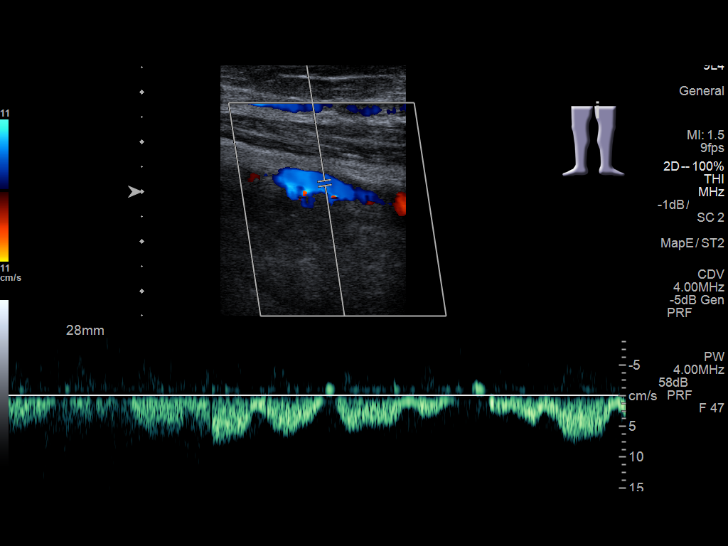
[im 21/40]
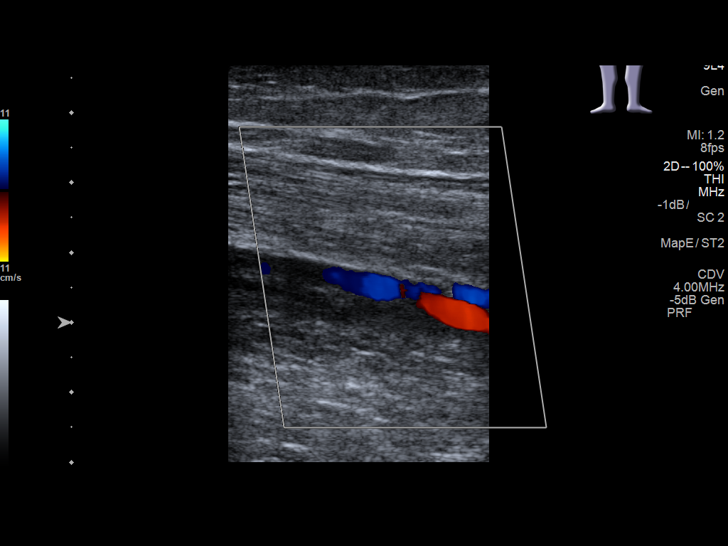
[im 23/40]
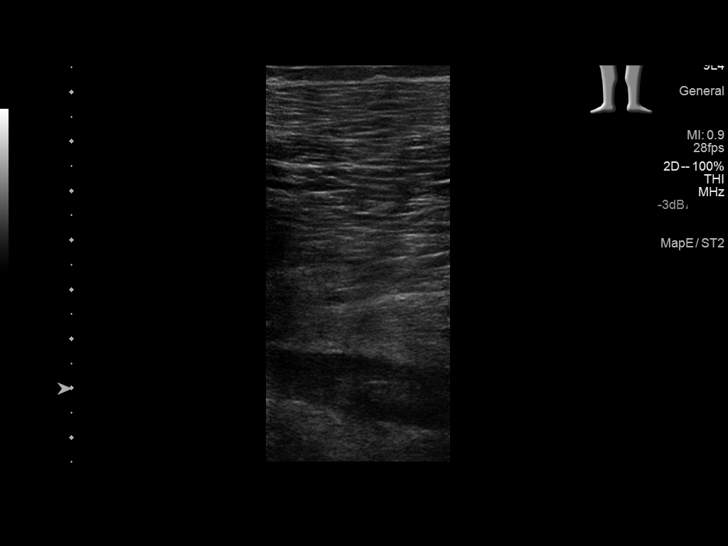
[im 26/40]
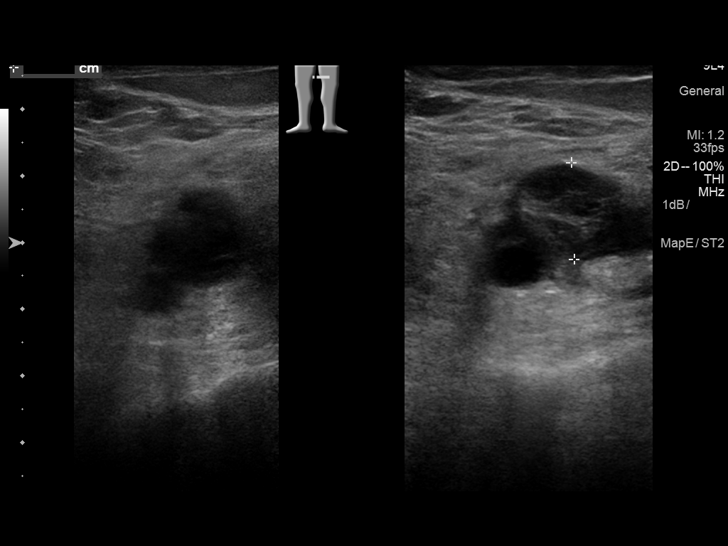
[im 29/40]
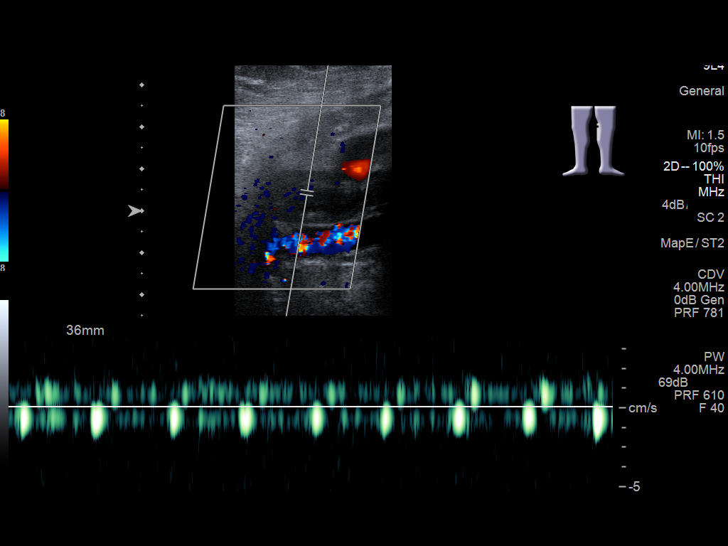
[im 33/40]
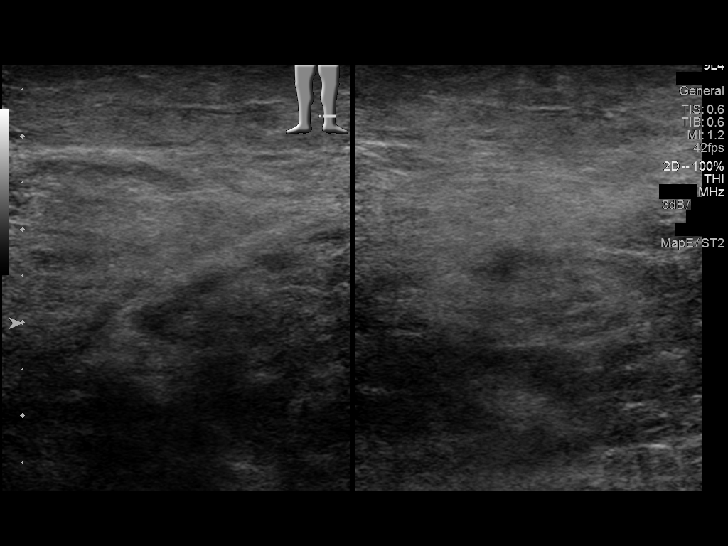
[im 36/40]
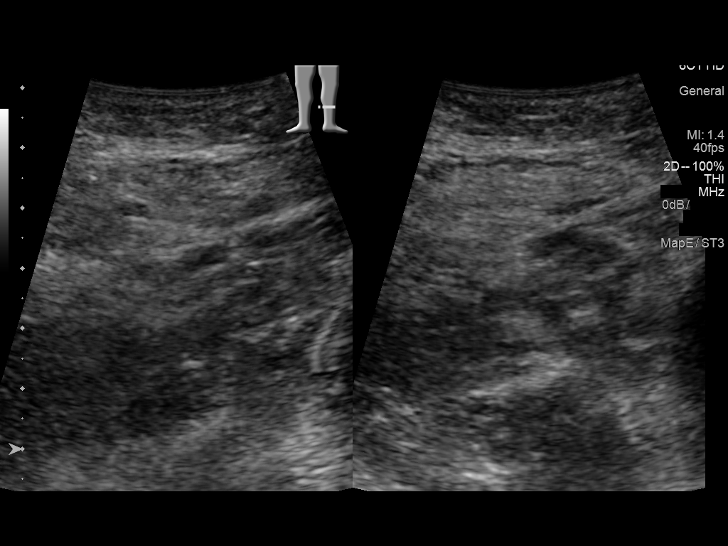
[im 40/40]
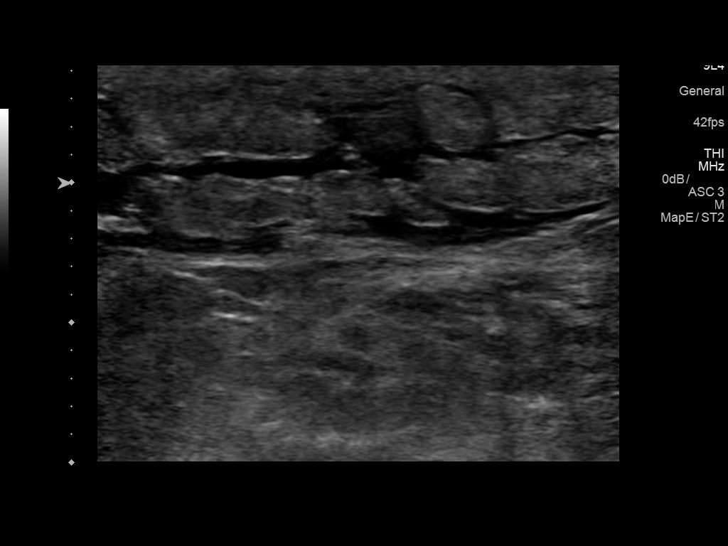

[13 of 24 positions shown; findings below may reference images not displayed]

FINDINGS: Contralateral Common Femoral Vein: Respiratory phasicity is normal
and symmetric with the symptomatic side. No evidence of thrombus.
Normal compressibility.

Common Femoral Vein: No evidence of thrombus. Normal compressibility
and flow on color Doppler imaging.

Saphenofemoral Junction: Nonocclusive thrombus noted.

Profunda Femoral Vein: No evidence of thrombus. Normal
compressibility and flow on color Doppler imaging.

Femoral Vein: Nonocclusive thrombus .

Popliteal Vein: Nonocclusive thrombus.

Calf Veins: Limited evaluation due to edema.

Superficial Great Saphenous Vein: No evidence of thrombus. Normal
compressibility and flow on color Doppler imaging.

Other Findings:  None.
IMPRESSION: Positive study for DVT.

## 2018-12-12 ENCOUNTER — Ambulatory Visit: Payer: Self-pay | Admitting: Surgery

## 2018-12-12 NOTE — H&P (Signed)
Subjective:   CC: Umbilical hernia without obstruction and without gangrene [K42.9]  HPI:  Jerome Arnold is a 53 y.o. male who was referred by Sabra Heck for evaluation of above. Symptoms were first noted a few years ago. Pain is sharp and intermittent, confined to the periumbilical area, without radiation.  Associated with lump, exacerbated by movement.  Lump is reducible today, but had an episode earlier in the week where it felt stuck for few hours with increasing pain.   Patient has no symptoms of  difficulty urinating.    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  Past Surgical History:       Past Surgical History:  Procedure Laterality Date  . COLONOSCOPY  04/23/2017   Int Hemorrhoids, Diverticulosis: CBF 03/2027  . hand surgery Right    tendons repaired  -- 8th grade  . INGUINAL HERNIA REPAIR Left 07/20/2016   Dr Rochel Brome  . Right knee tumor, benign    . throat surgery     3rd grade - sliced from a door and repaired  . TONSILLECTOMY      Family History: family history includes Brain cancer in his mother; Colon cancer in his maternal grandfather; Coronary Artery Disease (Blocked arteries around heart) in his father; High blood pressure (Hypertension) in his brother.  Social History:  reports that he has never smoked. He has never used smokeless tobacco. He reports current alcohol use of about 2.0 standard drinks of alcohol per week. He reports that he does not use drugs.  Current Medications: has a current medication list which includes the following prescription(s): acetaminophen, acyclovir, cyclobenzaprine, doxazosin, epinephrine, telmisartan-hydrochlorothiazide, and tadalafil.  Allergies:       Allergies as of 12/12/2018 - Reviewed 12/10/2018  Allergen Reaction Noted  . Guaifenesin Shortness Of Breath 07/22/2015  . Nut - unspecified Shortness Of  Breath 03/15/2016    ROS:  A 15 point review of systems was performed and pertinent positives and negatives noted in HPI   Objective:   BP 148/82   Pulse 70   Ht 175.3 cm (5\' 9" )   Wt (!) 116.6 kg (257 lb)   BMI 37.95 kg/m   Constitutional :  alert, appears stated age, cooperative and no distress  Lymphatics/Throat:  no asymmetry, masses, or scars  Respiratory:  clear to auscultation bilaterally  Cardiovascular:  regular rate and rhythm  Gastrointestinal: soft, non-tender; bowel sounds normal; no masses,  no organomegaly. umbilical hernia noted.  small and reducible  Musculoskeletal: Steady gait and movement  Skin: Cool and moist, visible surgical scars left groin from previous hernia surgery  Psychiatric: Normal affect, non-agitated, not confused       LABS:  n/a   RADS: n/a Assessment:       Umbilical hernia without obstruction and without gangrene [K42.9]  Hx of DVT after hernia repair, currently not on any anti-coag  Plan:   1. Umbilical hernia without obstruction and without gangrene [K42.9]   Discussed the risk of surgery including recurrence, which can be up to 50% in the case of incisional or complex hernias, possible use of prosthetic materials (mesh) and the increased risk of mesh infxn if used, bleeding, chronic pain, post-op infxn, post-op SBO or ileus, and possible re-operation to address said risks. The risks of general anesthetic, if used, includes MI, CVA, sudden death or even reaction to anesthetic medications also discussed. Alternatives include continued observation.  Benefits include possible symptom  relief, prevention of incarceration, strangulation, enlargement in size over time, and the risk of emergency surgery in the face of strangulation.   Typical post-op recovery time of 3-5 days with 4-6 weeks of activity restrictions were also discussed.  ED return precautions given for sudden increase in pain, size of hernia with accompanying  fever, nausea, and/or vomiting.  The patient verbalized understanding and all questions were answered to the patient's satisfaction.   2. Patient has elected to proceed with surgical treatment. Procedure will be scheduled.  Written consent was obtained.  Will add lovenox pre-op to prevent another DVT episode.  

## 2018-12-12 NOTE — H&P (View-Only) (Signed)
Subjective:   CC: Umbilical hernia without obstruction and without gangrene [K42.9]  HPI:  Jerome Arnold is a 53 y.o. male who was referred by Sabra Heck for evaluation of above. Symptoms were first noted a few years ago. Pain is sharp and intermittent, confined to the periumbilical area, without radiation.  Associated with lump, exacerbated by movement.  Lump is reducible today, but had an episode earlier in the week where it felt stuck for few hours with increasing pain.   Patient has no symptoms of  difficulty urinating.    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  Past Surgical History:       Past Surgical History:  Procedure Laterality Date  . COLONOSCOPY  04/23/2017   Int Hemorrhoids, Diverticulosis: CBF 03/2027  . hand surgery Right    tendons repaired  -- 8th grade  . INGUINAL HERNIA REPAIR Left 07/20/2016   Dr Rochel Brome  . Right knee tumor, benign    . throat surgery     3rd grade - sliced from a door and repaired  . TONSILLECTOMY      Family History: family history includes Brain cancer in his mother; Colon cancer in his maternal grandfather; Coronary Artery Disease (Blocked arteries around heart) in his father; High blood pressure (Hypertension) in his brother.  Social History:  reports that he has never smoked. He has never used smokeless tobacco. He reports current alcohol use of about 2.0 standard drinks of alcohol per week. He reports that he does not use drugs.  Current Medications: has a current medication list which includes the following prescription(s): acetaminophen, acyclovir, cyclobenzaprine, doxazosin, epinephrine, telmisartan-hydrochlorothiazide, and tadalafil.  Allergies:       Allergies as of 12/12/2018 - Reviewed 12/10/2018  Allergen Reaction Noted  . Guaifenesin Shortness Of Breath 07/22/2015  . Nut - unspecified Shortness Of  Breath 03/15/2016    ROS:  A 15 point review of systems was performed and pertinent positives and negatives noted in HPI   Objective:   BP 148/82   Pulse 70   Ht 175.3 cm (5\' 9" )   Wt (!) 116.6 kg (257 lb)   BMI 37.95 kg/m   Constitutional :  alert, appears stated age, cooperative and no distress  Lymphatics/Throat:  no asymmetry, masses, or scars  Respiratory:  clear to auscultation bilaterally  Cardiovascular:  regular rate and rhythm  Gastrointestinal: soft, non-tender; bowel sounds normal; no masses,  no organomegaly. umbilical hernia noted.  small and reducible  Musculoskeletal: Steady gait and movement  Skin: Cool and moist, visible surgical scars left groin from previous hernia surgery  Psychiatric: Normal affect, non-agitated, not confused       LABS:  n/a   RADS: n/a Assessment:       Umbilical hernia without obstruction and without gangrene [K42.9]  Hx of DVT after hernia repair, currently not on any anti-coag  Plan:   1. Umbilical hernia without obstruction and without gangrene [K42.9]   Discussed the risk of surgery including recurrence, which can be up to 50% in the case of incisional or complex hernias, possible use of prosthetic materials (mesh) and the increased risk of mesh infxn if used, bleeding, chronic pain, post-op infxn, post-op SBO or ileus, and possible re-operation to address said risks. The risks of general anesthetic, if used, includes MI, CVA, sudden death or even reaction to anesthetic medications also discussed. Alternatives include continued observation.  Benefits include possible symptom  relief, prevention of incarceration, strangulation, enlargement in size over time, and the risk of emergency surgery in the face of strangulation.   Typical post-op recovery time of 3-5 days with 4-6 weeks of activity restrictions were also discussed.  ED return precautions given for sudden increase in pain, size of hernia with accompanying  fever, nausea, and/or vomiting.  The patient verbalized understanding and all questions were answered to the patient's satisfaction.   2. Patient has elected to proceed with surgical treatment. Procedure will be scheduled.  Written consent was obtained.  Will add lovenox pre-op to prevent another DVT episode.

## 2018-12-17 ENCOUNTER — Other Ambulatory Visit: Payer: Self-pay

## 2018-12-17 ENCOUNTER — Encounter
Admission: RE | Admit: 2018-12-17 | Discharge: 2018-12-17 | Disposition: A | Discharge status: Home / Self Care | Payer: 59 | Source: Ambulatory Visit | Attending: Surgery | Admitting: Surgery

## 2018-12-17 DIAGNOSIS — I10 Essential (primary) hypertension: Secondary | ICD-10-CM | POA: Insufficient documentation

## 2018-12-17 DIAGNOSIS — Z01818 Encounter for other preprocedural examination: Secondary | ICD-10-CM | POA: Diagnosis present

## 2018-12-17 NOTE — Patient Instructions (Signed)
Your procedure is scheduled on: 12-23-18 TUESDAY Report to Same Day Surgery 2nd floor medical mall [facility](Medical Mall Entrance-take elevator on left to 2nd floor.  Check in with surgery information desk.) To find out your arrival time please call [phone] between 1PM - 3PM on 12-22-18 MONDAY  Remember: Instructions that are not followed completely may result in serious medical risk, up to and including death, or upon the discretion of your surgeon and anesthesiologist your surgery may need to be rescheduled.    _x___ 1. Do not eat food after midnight the night before your procedure. NO GUM OR CANDY MIDNIGHT. You may drink clear liquids up to 2 hours before you are scheduled to arrive at the hospital for your procedure.  Do not drink clear liquids within 2 hours of your scheduled arrival to the hospital.  Clear liquids include  --Water or Apple juice without pulp  --Clear carbohydrate beverage such as ClearFast or Gatorade  --Black Coffee or Clear Tea (No milk, no creamers, do not add anything to the coffee or Tea   ____Ensure clear carbohydrate drink on the way to the hospital for bariatric patients  ____Ensure clear carbohydrate drink 3 hours before surgery.    __x__ 2. No Alcohol for 24 hours before or after surgery.   __x__3. No Smoking or e-cigarettes for 24 prior to surgery.  Do not use any chewable tobacco products for at least 6 hour prior to surgery   ____  4. Bring all medications with you on the day of surgery if instructed.    __x__ 5. Notify your doctor if there is any change in your medical condition     (cold, fever, infections).    x___6. On the morning of surgery brush your teeth with toothpaste and water.  You may rinse your mouth with mouth wash if you wish.  Do not swallow any toothpaste or mouthwash.   Do not wear jewelry, make-up, hairpins, clips or nail polish.  Do not wear lotions, powders, or perfumes. You may wear deodorant.  Do not shave 48 hours prior to  surgery. Men may shave face and neck.  Do not bring valuables to the hospital.    [facility] not responsible for any belongings or valuables.               Contacts, dentures or bridgework may not be worn into surgery.  Leave your suitcase in the car. After surgery it may be brought to your room.  For patients admitted to the hospital, discharge time is determined by your treatment team.  _  Patients discharged the day of surgery will not be allowed to drive home.  You will need someone to drive you home and stay with you the night of your procedure.    Please read over the following fact sheets that you were given:   [facility] Preparing for Surgery and or MRSA Information   _x___ TAKE THE FOLLOWING MEDICATION THE MORNING OF SURGERY WITH A SMALL SIP OF WATER. These include:  1. DOXAZOSIN (CARDURA)  2. PRILOSEC (OMEPRAZOLE)  3. LORATADINE   4.   5.  6.  ____Fleets enema or Magnesium Citrate as directed.   _x___ Use CHG Soap or sage wipes as directed on instruction sheet   ____ Use inhalers on the day of surgery and bring to hospital day of surgery  ____ Stop Metformin and Janumet 2 days prior to surgery.    ____ Take 1/2 of usual insulin dose the night before  surgery and none on the morning surgery.   ____ Follow recommendations from Cardiologist, Pulmonologist or PCP regarding stopping Aspirin, Coumadin, Plavix ,Eliquis, Effient, or Pradaxa, and Pletal.  X____Stop Anti-inflammatories such as Advil, Aleve, Ibuprofen, Motrin, Naproxen, Naprosyn, Goodies powders or aspirin products NOW- OK to take Tylenol    ____ Stop supplements until after surgery.   ____ Bring C-Pap to the hospital.

## 2018-12-19 ENCOUNTER — Encounter
Admission: RE | Admit: 2018-12-19 | Discharge: 2018-12-19 | Disposition: A | Discharge status: Home / Self Care | Payer: 59 | Source: Ambulatory Visit | Attending: Surgery | Admitting: Surgery

## 2018-12-19 ENCOUNTER — Other Ambulatory Visit: Payer: Self-pay

## 2018-12-19 DIAGNOSIS — Z01818 Encounter for other preprocedural examination: Secondary | ICD-10-CM | POA: Diagnosis not present

## 2018-12-20 LAB — SARS CORONAVIRUS 2 (TAT 6-24 HRS): SARS Coronavirus 2: NEGATIVE

## 2018-12-22 MED ORDER — CEFAZOLIN SODIUM-DEXTROSE 2-4 GM/100ML-% IV SOLN
2.0000 g | INTRAVENOUS | Status: AC
  Administered 2018-12-23: 08:00:00 2 g via INTRAVENOUS

## 2018-12-23 ENCOUNTER — Ambulatory Visit
Admission: RE | Admit: 2018-12-23 | Discharge: 2018-12-23 | Disposition: A | Discharge status: Home / Self Care | Payer: 59 | Source: Ambulatory Visit | Attending: Surgery | Admitting: Surgery

## 2018-12-23 ENCOUNTER — Encounter
Admission: RE | Disposition: A | Discharge status: Home / Self Care | Payer: Self-pay | Source: Ambulatory Visit | Attending: Surgery

## 2018-12-23 ENCOUNTER — Encounter: Payer: Self-pay | Admitting: *Deleted

## 2018-12-23 ENCOUNTER — Ambulatory Visit: Payer: 59 | Admitting: Anesthesiology

## 2018-12-23 DIAGNOSIS — Z91018 Allergy to other foods: Secondary | ICD-10-CM | POA: Diagnosis not present

## 2018-12-23 DIAGNOSIS — Z86718 Personal history of other venous thrombosis and embolism: Secondary | ICD-10-CM | POA: Insufficient documentation

## 2018-12-23 DIAGNOSIS — Z808 Family history of malignant neoplasm of other organs or systems: Secondary | ICD-10-CM | POA: Insufficient documentation

## 2018-12-23 DIAGNOSIS — K420 Umbilical hernia with obstruction, without gangrene: Secondary | ICD-10-CM | POA: Diagnosis present

## 2018-12-23 DIAGNOSIS — Z8719 Personal history of other diseases of the digestive system: Secondary | ICD-10-CM | POA: Insufficient documentation

## 2018-12-23 DIAGNOSIS — Z800 Family history of malignant neoplasm of digestive organs: Secondary | ICD-10-CM | POA: Diagnosis not present

## 2018-12-23 DIAGNOSIS — I10 Essential (primary) hypertension: Secondary | ICD-10-CM | POA: Insufficient documentation

## 2018-12-23 DIAGNOSIS — Z87442 Personal history of urinary calculi: Secondary | ICD-10-CM | POA: Diagnosis not present

## 2018-12-23 DIAGNOSIS — Z888 Allergy status to other drugs, medicaments and biological substances status: Secondary | ICD-10-CM | POA: Diagnosis not present

## 2018-12-23 DIAGNOSIS — Z8249 Family history of ischemic heart disease and other diseases of the circulatory system: Secondary | ICD-10-CM | POA: Insufficient documentation

## 2018-12-23 DIAGNOSIS — Z87898 Personal history of other specified conditions: Secondary | ICD-10-CM | POA: Diagnosis not present

## 2018-12-23 DIAGNOSIS — Z79899 Other long term (current) drug therapy: Secondary | ICD-10-CM | POA: Diagnosis not present

## 2018-12-23 DIAGNOSIS — K219 Gastro-esophageal reflux disease without esophagitis: Secondary | ICD-10-CM | POA: Diagnosis not present

## 2018-12-23 HISTORY — PX: UMBILICAL HERNIA REPAIR: SHX196

## 2018-12-23 SURGERY — REPAIR, HERNIA, UMBILICAL, ADULT
Anesthesia: General

## 2018-12-23 MED ORDER — BUPIVACAINE-EPINEPHRINE (PF) 0.5% -1:200000 IJ SOLN
INTRAMUSCULAR | Status: AC
  Filled 2018-12-23: qty 30

## 2018-12-23 MED ORDER — CHLORHEXIDINE GLUCONATE CLOTH 2 % EX PADS
6.0000 | MEDICATED_PAD | Freq: Once | CUTANEOUS | Status: AC
  Administered 2018-12-23: 6 via TOPICAL

## 2018-12-23 MED ORDER — PROPOFOL 10 MG/ML IV BOLUS
INTRAVENOUS | Status: AC
  Filled 2018-12-23: qty 60

## 2018-12-23 MED ORDER — PHENYLEPHRINE HCL (PRESSORS) 10 MG/ML IV SOLN
INTRAVENOUS | Status: DC | PRN
  Administered 2018-12-23: 160 ug via INTRAVENOUS
  Administered 2018-12-23 (×3): 80 ug via INTRAVENOUS

## 2018-12-23 MED ORDER — LACTATED RINGERS IV SOLN
INTRAVENOUS | Status: DC
  Administered 2018-12-23 (×2): via INTRAVENOUS

## 2018-12-23 MED ORDER — MIDAZOLAM HCL 2 MG/2ML IJ SOLN
INTRAMUSCULAR | Status: AC
  Filled 2018-12-23: qty 2

## 2018-12-23 MED ORDER — ROCURONIUM BROMIDE 100 MG/10ML IV SOLN
INTRAVENOUS | Status: DC | PRN
  Administered 2018-12-23: 20 mg via INTRAVENOUS
  Administered 2018-12-23: 30 mg via INTRAVENOUS

## 2018-12-23 MED ORDER — EPHEDRINE SULFATE 50 MG/ML IJ SOLN
INTRAMUSCULAR | Status: DC | PRN
  Administered 2018-12-23: 10 mg via INTRAVENOUS

## 2018-12-23 MED ORDER — ACETAMINOPHEN 500 MG PO TABS
1000.0000 mg | ORAL_TABLET | ORAL | Status: AC
  Administered 2018-12-23: 1000 mg via ORAL

## 2018-12-23 MED ORDER — PROPOFOL 10 MG/ML IV BOLUS
INTRAVENOUS | Status: DC | PRN
  Administered 2018-12-23: 200 mg via INTRAVENOUS

## 2018-12-23 MED ORDER — CELECOXIB 200 MG PO CAPS
200.0000 mg | ORAL_CAPSULE | ORAL | Status: AC
  Administered 2018-12-23: 200 mg via ORAL

## 2018-12-23 MED ORDER — SUGAMMADEX SODIUM 200 MG/2ML IV SOLN
INTRAVENOUS | Status: AC
  Filled 2018-12-23: qty 2

## 2018-12-23 MED ORDER — ACETAMINOPHEN 325 MG PO TABS: 650.0000 mg | ORAL_TABLET | Freq: Three times a day (TID) | ORAL | 0 refills | Status: AC | PRN

## 2018-12-23 MED ORDER — MIDAZOLAM HCL 2 MG/2ML IJ SOLN
INTRAMUSCULAR | Status: DC | PRN
  Administered 2018-12-23: 2 mg via INTRAVENOUS

## 2018-12-23 MED ORDER — GABAPENTIN 300 MG PO CAPS
300.0000 mg | ORAL_CAPSULE | ORAL | Status: AC
  Administered 2018-12-23: 300 mg via ORAL

## 2018-12-23 MED ORDER — ENOXAPARIN SODIUM 40 MG/0.4ML ~~LOC~~ SOLN
40.0000 mg | Freq: Once | SUBCUTANEOUS | Status: AC
  Administered 2018-12-23: 40 mg via SUBCUTANEOUS
  Filled 2018-12-23: qty 0.4

## 2018-12-23 MED ORDER — IBUPROFEN 800 MG PO TABS: 800.0000 mg | ORAL_TABLET | Freq: Three times a day (TID) | ORAL | 0 refills | Status: DC | PRN

## 2018-12-23 MED ORDER — BUPIVACAINE LIPOSOME 1.3 % IJ SUSP
INTRAMUSCULAR | Status: DC | PRN
  Administered 2018-12-23: 20 mL

## 2018-12-23 MED ORDER — FENTANYL CITRATE (PF) 100 MCG/2ML IJ SOLN
INTRAMUSCULAR | Status: DC | PRN
  Administered 2018-12-23: 50 ug via INTRAVENOUS
  Administered 2018-12-23: 100 ug via INTRAVENOUS

## 2018-12-23 MED ORDER — BUPIVACAINE LIPOSOME 1.3 % IJ SUSP
INTRAMUSCULAR | Status: AC
  Filled 2018-12-23: qty 20

## 2018-12-23 MED ORDER — SUGAMMADEX SODIUM 200 MG/2ML IV SOLN
INTRAVENOUS | Status: DC | PRN
  Administered 2018-12-23: 400 mg via INTRAVENOUS

## 2018-12-23 MED ORDER — OXYCODONE HCL 5 MG PO TABS: 5.0000 mg | ORAL_TABLET | Freq: Once | ORAL | Status: DC | PRN

## 2018-12-23 MED ORDER — BUPIVACAINE-EPINEPHRINE (PF) 0.5% -1:200000 IJ SOLN
INTRAMUSCULAR | Status: DC | PRN
  Administered 2018-12-23: 6 mL
  Administered 2018-12-23: 10 mL

## 2018-12-23 MED ORDER — HYDROCODONE-ACETAMINOPHEN 5-325 MG PO TABS
ORAL_TABLET | ORAL | Status: AC
  Filled 2018-12-23: qty 1

## 2018-12-23 MED ORDER — LIDOCAINE HCL (CARDIAC) PF 100 MG/5ML IV SOSY
PREFILLED_SYRINGE | INTRAVENOUS | Status: DC | PRN
  Administered 2018-12-23: 100 mg via INTRAVENOUS

## 2018-12-23 MED ORDER — SUCCINYLCHOLINE CHLORIDE 20 MG/ML IJ SOLN
INTRAMUSCULAR | Status: AC
  Filled 2018-12-23: qty 1

## 2018-12-23 MED ORDER — ONDANSETRON HCL 4 MG/2ML IJ SOLN
INTRAMUSCULAR | Status: AC
  Filled 2018-12-23: qty 2

## 2018-12-23 MED ORDER — HYDROCODONE-ACETAMINOPHEN 5-325 MG PO TABS: 1.0000 | ORAL_TABLET | Freq: Four times a day (QID) | ORAL | 0 refills | Status: AC | PRN

## 2018-12-23 MED ORDER — DEXMEDETOMIDINE HCL IN NACL 80 MCG/20ML IV SOLN
INTRAVENOUS | Status: AC
  Filled 2018-12-23: qty 20

## 2018-12-23 MED ORDER — FENTANYL CITRATE (PF) 250 MCG/5ML IJ SOLN
INTRAMUSCULAR | Status: AC
  Filled 2018-12-23: qty 5

## 2018-12-23 MED ORDER — SUCCINYLCHOLINE CHLORIDE 20 MG/ML IJ SOLN
INTRAMUSCULAR | Status: DC | PRN
  Administered 2018-12-23: 200 mg via INTRAVENOUS

## 2018-12-23 MED ORDER — FENTANYL CITRATE (PF) 100 MCG/2ML IJ SOLN: 25.0000 ug | INTRAMUSCULAR | Status: DC | PRN

## 2018-12-23 MED ORDER — ROCURONIUM BROMIDE 50 MG/5ML IV SOLN
INTRAVENOUS | Status: AC
  Filled 2018-12-23: qty 1

## 2018-12-23 MED ORDER — DOCUSATE SODIUM 100 MG PO CAPS: 100.0000 mg | ORAL_CAPSULE | Freq: Two times a day (BID) | ORAL | 0 refills | Status: AC | PRN

## 2018-12-23 MED ORDER — ONDANSETRON HCL 4 MG/2ML IJ SOLN
INTRAMUSCULAR | Status: DC | PRN
  Administered 2018-12-23: 4 mg via INTRAVENOUS

## 2018-12-23 MED ORDER — OXYCODONE HCL 5 MG/5ML PO SOLN: 5.0000 mg | Freq: Once | ORAL | Status: DC | PRN

## 2018-12-23 MED ORDER — DEXMEDETOMIDINE HCL IN NACL 200 MCG/50ML IV SOLN
INTRAVENOUS | Status: DC | PRN
  Administered 2018-12-23: 8 ug via INTRAVENOUS

## 2018-12-23 MED ORDER — LIDOCAINE HCL (PF) 2 % IJ SOLN
INTRAMUSCULAR | Status: AC
  Filled 2018-12-23: qty 10

## 2018-12-23 SURGICAL SUPPLY — 37 items
BLADE SURG 15 STRL LF DISP TIS (BLADE) ×1 IMPLANT
BLADE SURG 15 STRL SS (BLADE) ×2
CANISTER SUCT 1200ML W/VALVE (MISCELLANEOUS) ×3 IMPLANT
CHLORAPREP W/TINT 26 (MISCELLANEOUS) ×3 IMPLANT
COVER WAND RF STERILE (DRAPES) ×3 IMPLANT
DERMABOND ADVANCED (GAUZE/BANDAGES/DRESSINGS) ×2
DERMABOND ADVANCED .7 DNX12 (GAUZE/BANDAGES/DRESSINGS) ×1 IMPLANT
DRAPE LAPAROTOMY 77X122 PED (DRAPES) ×3 IMPLANT
DRSG TEGADERM 2-3/8X2-3/4 SM (GAUZE/BANDAGES/DRESSINGS) ×2 IMPLANT
ELECT REM PT RETURN 9FT ADLT (ELECTROSURGICAL) ×3
ELECTRODE REM PT RTRN 9FT ADLT (ELECTROSURGICAL) ×1 IMPLANT
GLOVE BIOGEL PI IND STRL 7.0 (GLOVE) ×1 IMPLANT
GLOVE BIOGEL PI INDICATOR 7.0 (GLOVE) ×2
GLOVE SURG SYN 6.5 ES PF (GLOVE) ×3 IMPLANT
GLOVE SURG SYN 6.5 PF PI (GLOVE) ×1 IMPLANT
GOWN STRL REUS W/ TWL LRG LVL3 (GOWN DISPOSABLE) ×3 IMPLANT
GOWN STRL REUS W/TWL LRG LVL3 (GOWN DISPOSABLE) ×6
KIT TURNOVER KIT A (KITS) ×3 IMPLANT
LABEL OR SOLS (LABEL) ×1 IMPLANT
MESH VENTRALEX ST 8CM LRG (Mesh General) ×2 IMPLANT
NEEDLE HYPO 22GX1.5 SAFETY (NEEDLE) ×3 IMPLANT
NS IRRIG 500ML POUR BTL (IV SOLUTION) ×3 IMPLANT
PACK BASIN MINOR ARMC (MISCELLANEOUS) ×3 IMPLANT
SPONGE GAUZE 2X2 8PLY STER LF (GAUZE/BANDAGES/DRESSINGS) ×1
SPONGE GAUZE 2X2 8PLY STRL LF (GAUZE/BANDAGES/DRESSINGS) ×1 IMPLANT
SUT ETHIBOND 0 MO6 C/R (SUTURE) ×2 IMPLANT
SUT ETHIBOND NAB MO 7 #0 18IN (SUTURE) ×3 IMPLANT
SUT MNCRL 4-0 (SUTURE) ×2
SUT MNCRL 4-0 27XMFL (SUTURE) ×1
SUT VIC AB 2-0 SH 27 (SUTURE) ×2
SUT VIC AB 2-0 SH 27XBRD (SUTURE) ×1 IMPLANT
SUT VIC AB 3-0 SH 27 (SUTURE) ×2
SUT VIC AB 3-0 SH 27X BRD (SUTURE) ×1 IMPLANT
SUTURE MNCRL 4-0 27XMF (SUTURE) ×1 IMPLANT
SYR 10ML LL (SYRINGE) ×6 IMPLANT
TOWEL OR 17X26 4PK STRL BLUE (TOWEL DISPOSABLE) ×3 IMPLANT
WATER STERILE IRR 1000ML POUR (IV SOLUTION) ×1 IMPLANT

## 2018-12-23 NOTE — Op Note (Signed)
Preoperative diagnosis: umbilical hernia, incarcerated Postoperative diagnosis: same  Procedure:  Open umbilical hernia repair with mesh  Anesthesia: GETA  Surgeon: Benjamine Sprague  Wound Classification: Clean  Specimen: none  Complications: None  Estimated Blood Loss: minimal  Indications:see HPI  Findings: 1. 3cm x 2cm incarcerated umbilical hernia 4. Tension free repair achieved with mesh and suture 5. Adequate hemostasis  Description of procedure: The patient was brought to the operating room and general anesthesia was induced. A time-out was completed verifying correct patient, procedure, site, positioning, and implant(s) and/or special equipment prior to beginning this procedure. Antibiotics were administered prior to making the incision. SCDs placed. The anterior abdominal wall was prepped and draped in the standard sterile fashion.   An infraumbilical incision was made after infusing the preplanned incision with half percent Marcaine.  Dissection carried down to fascia where the umbilical stalk was noted.  The stalk was transected and there was noted to be a 3cm x 2cm umbilical hernia.  The preperitoneal fat contents were dissected off the surrounding structures and reduced.  Hemostasis was confirmed prior to reducing the actual contents. Due to size of defect, decision was made to proceed with a mesh repair.    A 8cm umbilical mesh was placed within the preperitoneal cavity through the present defect and secured in place on the abdominal wall using interrupted 0 Ethibond sutures at cardinal points.  Once the mesh was noted to be laying flat and secured to the abdominal wall the defect itself was primary closed using 0 Ethibond in a interrupted fashion.  The fascia as well as the skin incision was then infused with 20 mL's of Exparel expanded with 10 mL's of half percent Marcaine.  After confirming hemostasis, the umbilical stalk was reattached to the abdominal wall using 2-0 Vicryl  and the wound was irrigated and closed in a multilayer fashion, using 3-0 Vicryl for the deep dermal layer in an interrupted fashion and running 4-0 Monocryl in a subcuticular fashion.  Wound was then dressed with Dermabond, 2x2 placed within umbilicus, and covered with tegarderm.  Patient was then successfully awakened and transferred to PACU in stable condition.  At the end of the procedure sponge and instrument counts were correct

## 2018-12-23 NOTE — Anesthesia Postprocedure Evaluation (Signed)
Anesthesia Post Note  Patient: Jerome Arnold  Procedure(s) Performed: OPEN HERNIA REPAIR UMBILICAL ADULT WITH MESH (N/A )  Patient location during evaluation: PACU Anesthesia Type: General Level of consciousness: awake and alert Pain management: pain level controlled Vital Signs Assessment: post-procedure vital signs reviewed and stable Respiratory status: spontaneous breathing, nonlabored ventilation, respiratory function stable and patient connected to nasal cannula oxygen Cardiovascular status: blood pressure returned to baseline and stable Postop Assessment: no apparent nausea or vomiting Anesthetic complications: no     Last Vitals:  Vitals:   12/23/18 0950 12/23/18 1019  BP: 129/70 117/82  Pulse: 70 69  Resp: 18 16  Temp: 36.4 C   SpO2: 94% 94%    Last Pain:  Vitals:   12/23/18 1019  TempSrc:   PainSc: 0-No pain                 Precious Haws Piscitello

## 2018-12-23 NOTE — Discharge Instructions (Signed)
Hernia repair, Care After This sheet gives you information about how to care for yourself after your procedure. Your health care provider may also give you more specific instructions. If you have problems or questions, contact your health care provider. What can I expect after the procedure? After your procedure, it is common to have the following:  Pain in your abdomen, especially in the incision areas. You will be given medicine to control the pain.  Tiredness. This is a normal part of the recovery process. Your energy level will return to normal over the next several weeks.  Changes in your bowel movements, such as constipation or needing to go more often. Talk with your health care provider about how to manage this. Follow these instructions at home: Medicines   tylenol and advil as needed for discomfort.  Please alternate between the two every four hours as needed for pain.     Use narcotics, if prescribed, only when tylenol and motrin is not enough to control pain.   325-650mg  every 8hrs to max of 3000mg /24hrs (including the 325mg  in every norco dose) for the tylenol.     Advil up to 800mg  per dose every 8hrs as needed for pain.    Do not drive or use heavy machinery while taking prescription pain medicine.  Do not drink alcohol while taking prescription pain medicine.  Incision care     Follow instructions from your health care provider about how to take care of your incision areas. Make sure you: ? Keep your incisions clean and dry. ? Wash your hands with soap and water before and after applying medicine to the areas, and before and after changing your bandage (dressing). If soap and water are not available, use hand sanitizer. ? Change your dressing as told by your health care provider. ? Leave stitches (sutures), skin glue, or adhesive strips in place. These skin closures may need to stay in place for 2 weeks or longer. If adhesive strip edges start to loosen and curl up,  you may trim the loose edges. Do not remove adhesive strips completely unless your health care provider tells you to do that.  Do not wear tight clothing over the incisions. Tight clothing may rub and irritate the incision areas, which may cause the incisions to open.  Do not take baths, swim, or use a hot tub until your health care provider approves. OK TO REMOVE OUTER DRESSING IN 24HRS AND SHOWER.  KEEP SKIN GLUE INTACT.  Check your incision area every day for signs of infection. Check for: ? More redness, swelling, or pain. ? More fluid or blood. ? Warmth. ? Pus or a bad smell. Activity  Avoid lifting anything that is heavier than 10 lb (4.5 kg) for 2 weeks or until your health care provider says it is okay.  No pushing/pulling greater than 30lbs  You may resume normal activities as told by your health care provider. Ask your health care provider what activities are safe for you.  Take rest breaks during the day as needed. Eating and drinking  Follow instructions from your health care provider about what you can eat after surgery.  To prevent or treat constipation while you are taking prescription pain medicine, your health care provider may recommend that you: ? Drink enough fluid to keep your urine clear or pale yellow. ? Take over-the-counter or prescription medicines. ? Eat foods that are high in fiber, such as fresh fruits and vegetables, whole grains, and beans. ? Limit foods that  are high in fat and processed sugars, such as fried and sweet foods. General instructions  Ask your health care provider when you will need an appointment to get your sutures or staples removed.  Keep all follow-up visits as told by your health care provider. This is important. Contact a health care provider if:  You have more redness, swelling, or pain around your incisions.  You have more fluid or blood coming from the incisions.  Your incisions feel warm to the touch.  You have pus or a  bad smell coming from your incisions or your dressing.  You have a fever.  You have an incision that breaks open (edges not staying together) after sutures or staples have been removed. Get help right away if:  You develop a rash.  You have chest pain or difficulty breathing.  You have pain or swelling in your legs.  You feel light-headed or you faint.  Your abdomen swells (becomes distended).  You have nausea or vomiting.  You have blood in your stool (feces). This information is not intended to replace advice given to you by your health care provider. Make sure you discuss any questions you have with your health care provider. Document Released: 12/01/2004 Document Revised: 01/31/2018 Document Reviewed: 02/13/2016 Elsevier Interactive Patient Education  2019 [place]   1) The drugs that you were given will stay in your system until tomorrow so for the next 24 hours you should not:  A) Drive an automobile B) Make any legal decisions C) Drink any alcoholic beverage   2) You may resume regular meals tomorrow.  Today it is better to start with liquids and gradually work up to solid foods.  You may eat anything you prefer, but it is better to start with liquids, then soup and crackers, and gradually work up to solid foods.   3) Please notify your doctor immediately if you have any unusual bleeding, trouble breathing, redness and pain at the surgery site, drainage, fever, or pain not relieved by medication.    4) Additional Instructions:        Please contact your physician with any problems or Same Day Surgery at [phone], Monday through Friday 6 am to 4 pm, or [place] at [facility] number at [phone].

## 2018-12-23 NOTE — Anesthesia Procedure Notes (Signed)
Procedure Name: Intubation Performed by: Johnpaul Gillentine, Dierdre Forth, CRNA Pre-anesthesia Checklist: Patient identified Patient Re-evaluated:Patient Re-evaluated prior to induction Oxygen Delivery Method: Circle system utilized Preoxygenation: Pre-oxygenation with 100% oxygen Induction Type: IV induction Ventilation: Mask ventilation without difficulty Laryngoscope Size: McGraph and 4 Grade View: Grade I Tube type: Oral Tube size: 7.5 mm Number of attempts: 1 Airway Equipment and Method: Stylet and Video-laryngoscopy Placement Confirmation: ETT inserted through vocal cords under direct vision,  positive ETCO2 and breath sounds checked- equal and bilateral Secured at: 22 cm Tube secured with: Tape Future Recommendations: Recommend- induction with short-acting agent, and alternative techniques readily available

## 2018-12-23 NOTE — Transfer of Care (Signed)
Immediate Anesthesia Transfer of Care Note  Patient: Jerome Arnold  Procedure(s) Performed: OPEN HERNIA REPAIR UMBILICAL ADULT WITH MESH (N/A )  Patient Location: PACU  Anesthesia Type:General  Level of Consciousness: awake, alert  and oriented  Airway & Oxygen Therapy: Patient Spontanous Breathing and Patient connected to face mask oxygen  Post-op Assessment: Report given to RN, Post -op Vital signs reviewed and stable, Patient moving all extremities and Patient able to stick tongue midline  Post vital signs: stable  Last Vitals:  Vitals Value Taken Time  BP 128/76 12/23/18 0911  Temp 36.4 C 12/23/18 0911  Pulse 83 12/23/18 0917  Resp 13 12/23/18 0917  SpO2 93 % 12/23/18 0917  Vitals shown include unvalidated device data.  Last Pain:  Vitals:   12/23/18 0911  TempSrc:   PainSc: 0-No pain         Complications: No apparent anesthesia complications

## 2018-12-23 NOTE — Anesthesia Preprocedure Evaluation (Signed)
Anesthesia Evaluation  Patient identified by MRN, date of birth, ID band Patient awake    Reviewed: Allergy & Precautions, H&P , NPO status , Patient's Chart, lab work & pertinent test results  History of Anesthesia Complications Negative for: history of anesthetic complications  Airway Mallampati: III  TM Distance: <3 FB Neck ROM: full    Dental  (+) Chipped   Pulmonary neg pulmonary ROS, neg shortness of breath,           Cardiovascular Exercise Tolerance: Good hypertension, (-) angina(-) Past MI and (-) DOE      Neuro/Psych negative neurological ROS  negative psych ROS   GI/Hepatic Neg liver ROS, GERD  Medicated and Controlled,  Endo/Other  negative endocrine ROS  Renal/GU      Musculoskeletal   Abdominal   Peds  Hematology negative hematology ROS (+)   Anesthesia Other Findings Past Medical History: No date: GERD (gastroesophageal reflux disease) No date: History of kidney stones     Comment:  H/O No date: Hypertension  Past Surgical History: No date: HAND SURGERY; Right     Comment:  STABBED WITH KNIFE 07/20/2016: INGUINAL HERNIA REPAIR; Left     Comment:  Procedure: HERNIA REPAIR INGUINAL ADULT;  Surgeon:               Leonie Green, MD;  Location: [facility] ORS;  Service:               General;  Laterality: Left; No date: THROAT SURGERY     Comment:  FELL ON GLASS No date: TUMOR REMOVAL     Comment:  AGE 53  BMI    Body Mass Index: 37.96 kg/m      Reproductive/Obstetrics negative OB ROS                             Anesthesia Physical Anesthesia Plan  ASA: III  Anesthesia Plan: General ETT   Post-op Pain Management:    Induction: Intravenous  PONV Risk Score and Plan: Ondansetron, Dexamethasone, Midazolam and Treatment may vary due to age or medical condition  Airway Management Planned: Oral ETT  Additional Equipment:   Intra-op Plan:   Post-operative  Plan: Extubation in OR  Informed Consent: I have reviewed the patients History and Physical, chart, labs and discussed the procedure including the risks, benefits and alternatives for the proposed anesthesia with the patient or authorized representative who has indicated his/her understanding and acceptance.     Dental Advisory Given  Plan Discussed with: Anesthesiologist, CRNA and Surgeon  Anesthesia Plan Comments: (Patient consented for risks of anesthesia including but not limited to:  - adverse reactions to medications - damage to teeth, lips or other oral mucosa - sore throat or hoarseness - Damage to heart, brain, lungs or loss of life  Patient voiced understanding.)        Anesthesia Quick Evaluation

## 2018-12-23 NOTE — Anesthesia Post-op Follow-up Note (Signed)
Anesthesia QCDR form completed.        

## 2018-12-23 NOTE — Interval H&P Note (Signed)
History and Physical Interval Note:  12/23/2018 6:50 AM  Jerome Arnold  has presented today for surgery, with the diagnosis of V69.4 UMBILICAL HERNIA W/O ABSTRUCTION AND W/O GANGRENE.  The various methods of treatment have been discussed with the patient and family. After consideration of risks, benefits and other options for treatment, the patient has consented to  Procedure(s): OPEN HERNIA REPAIR UMBILICAL ADULT WITH MESH (N/A) as a surgical intervention.  The patient's history has been reviewed, patient examined, no change in status, stable for surgery.  I have reviewed the patient's chart and labs.  Questions were answered to the patient's satisfaction.     [name]

## 2019-05-20 ENCOUNTER — Other Ambulatory Visit: Payer: Self-pay

## 2019-05-20 ENCOUNTER — Encounter: Payer: Self-pay | Admitting: Emergency Medicine

## 2019-05-20 ENCOUNTER — Emergency Department
Admission: EM | Admit: 2019-05-20 | Discharge: 2019-05-20 | Disposition: A | Discharge status: Home / Self Care | Payer: 59 | Attending: Emergency Medicine | Admitting: Emergency Medicine

## 2019-05-20 DIAGNOSIS — Z203 Contact with and (suspected) exposure to rabies: Secondary | ICD-10-CM | POA: Diagnosis present

## 2019-05-20 DIAGNOSIS — Z23 Encounter for immunization: Secondary | ICD-10-CM | POA: Insufficient documentation

## 2019-05-20 DIAGNOSIS — Z2914 Encounter for prophylactic rabies immune globin: Secondary | ICD-10-CM | POA: Insufficient documentation

## 2019-05-20 DIAGNOSIS — I10 Essential (primary) hypertension: Secondary | ICD-10-CM | POA: Diagnosis not present

## 2019-05-20 DIAGNOSIS — Z91010 Allergy to peanuts: Secondary | ICD-10-CM | POA: Insufficient documentation

## 2019-05-20 DIAGNOSIS — Z79899 Other long term (current) drug therapy: Secondary | ICD-10-CM | POA: Diagnosis not present

## 2019-05-20 MED ORDER — TETANUS-DIPHTH-ACELL PERTUSSIS 5-2.5-18.5 LF-MCG/0.5 IM SUSP
0.5000 mL | Freq: Once | INTRAMUSCULAR | Status: AC
  Administered 2019-05-20: 19:00:00 0.5 mL via INTRAMUSCULAR
  Filled 2019-05-20: qty 0.5

## 2019-05-20 MED ORDER — RABIES VACCINE, PCEC IM SUSR
1.0000 mL | Freq: Once | INTRAMUSCULAR | Status: AC
  Administered 2019-05-20: 1 mL via INTRAMUSCULAR
  Filled 2019-05-20: qty 1

## 2019-05-20 MED ORDER — RABIES IMMUNE GLOBULIN 150 UNIT/ML IM INJ
20.0000 [IU]/kg | INJECTION | Freq: Once | INTRAMUSCULAR | Status: AC
  Administered 2019-05-20: 2250 [IU] via INTRAMUSCULAR
  Filled 2019-05-20: qty 15

## 2019-05-20 NOTE — ED Provider Notes (Signed)
[facility] Emergency Department Provider Note  ____________________________________________  Time seen: Approximately 7:10 PM  I have reviewed the triage vital signs and the nursing notes.   HISTORY  Chief Complaint Animal Bite    HPI Jerome Arnold is a 53 y.o. male who presents the emergency department with several family members after exposure to a rabbit dog.  Patient and his family members were exposed yesterday.  Patient did not receive any bite injury from the dog but his hand was in the dog's mouth after the dog became aggressive.  Dog had been captured by animal control, was euthanized and autopsy revealed positive for rabies.  Patient and his family members were contacted by the health department and advised to present to the emergency department for rabies series.  Patient has no complaints at this time other than needing his rabies vaccine and immunoglobin.  Patient is up-to-date on his tetanus shot as well.  Patient denies any open wounds at this time.         Past Medical History:  Diagnosis Date  . GERD (gastroesophageal reflux disease)   . History of kidney stones    H/O  . Hypertension     There are no problems to display for this patient.   Past Surgical History:  Procedure Laterality Date  . HAND SURGERY Right    STABBED WITH KNIFE  . INGUINAL HERNIA REPAIR Left 07/20/2016   Procedure: HERNIA REPAIR INGUINAL ADULT;  Surgeon: Nadeen LandauJarvis Wilton Smith, MD;  Location: [facility] ORS;  Service: General;  Laterality: Left;  . THROAT SURGERY     FELL ON GLASS  . TUMOR REMOVAL     AGE 84  . UMBILICAL HERNIA REPAIR N/A 12/23/2018   Procedure: OPEN HERNIA REPAIR UMBILICAL ADULT WITH MESH;  Surgeon: Sung AmabileSakai, Isami, DO;  Location: [facility] ORS;  Service: General;  Laterality: N/A;    Prior to Admission medications   Medication Sig Start Date End Date Taking? Authorizing Provider  acyclovir (ZOVIRAX) 400 MG tablet Take 400 mg by mouth daily.    [provider]  cyclobenzaprine (FLEXERIL) 5 MG tablet Take 1-2 tablets (5-10 mg total) by mouth 3 (three) times daily as needed for muscle spasms. 01/20/18   Evon SlackGaines, Thomas C, PA-C  doxazosin (CARDURA) 2 MG tablet Take 2 mg by mouth every morning.     [provider]  ibuprofen (ADVIL) 800 MG tablet Take 1 tablet (800 mg total) by mouth every 8 (eight) hours as needed for mild pain or moderate pain. 12/23/18   Tonna BoehringerSakai, Isami, DO  loratadine (EQ ALLERGY RELIEF) 10 MG tablet Take 10 mg by mouth every morning.     [provider]  omeprazole (PRILOSEC) 20 MG capsule Take 20 mg by mouth every evening.    [provider]  telmisartan-hydrochlorothiazide (MICARDIS HCT) 80-12.5 MG tablet Take 1 tablet by mouth every morning.  09/25/17   [provider]    Allergies Guaifenesin & derivatives and Peanut-containing drug products  Family History  Problem Relation Age of Onset  . Brain cancer Mother   . Hypertension Father     Social History Social History   Tobacco Use  . Smoking status: Never Smoker  . Smokeless tobacco: Never Used  Substance Use Topics  . Alcohol use: Yes    Comment: BEER RARE  . Drug use: No     Review of Systems  Constitutional: No fever/chills Eyes: No visual changes. No discharge ENT: No upper respiratory complaints. Cardiovascular: no chest  pain. Respiratory: no cough. No SOB. Gastrointestinal: No abdominal pain.  No nausea, no vomiting.  Musculoskeletal: Negative for musculoskeletal pain. Skin: Negative for rash, abrasions, lacerations, ecchymosis. Neurological: Negative for headaches, focal weakness or numbness. 10-point ROS otherwise negative.  ____________________________________________   PHYSICAL EXAM:  VITAL SIGNS: ED Triage Vitals [05/20/19 1739]  Enc Vitals Group     BP      Pulse      Resp      Temp      Temp src      SpO2      Weight 250 lb (113.4 kg)     Height 5\' 9"  (1.753 m)     Head Circumference       Peak Flow      Pain Score 0     Pain Loc      Pain Edu?      Excl. in [place]?      Constitutional: Alert and oriented. Well appearing and in no acute distress. Eyes: Conjunctivae are normal. PERRL. EOMI. Head: Atraumatic. ENT:      Ears:       Nose: No congestion/rhinnorhea.      Mouth/Throat: Mucous membranes are moist.  Neck: No stridor.    Cardiovascular: Normal rate, regular rhythm. Normal S1 and S2.  Good peripheral circulation. Respiratory: Normal respiratory effort without tachypnea or retractions. Lungs CTAB. Good air entry to the bases with no decreased or absent breath sounds. Musculoskeletal: Full range of motion to all extremities. No gross deformities appreciated. Neurologic:  Normal speech and language. No gross focal neurologic deficits are appreciated.  Skin:  Skin is warm, dry and intact. No rash noted. Psychiatric: Mood and affect are normal. Speech and behavior are normal. Patient exhibits appropriate insight and judgement.   ____________________________________________   LABS (all labs ordered are listed, but only abnormal results are displayed)  Labs Reviewed - No data to display ____________________________________________  EKG   ____________________________________________  RADIOLOGY   No results found.  ____________________________________________    PROCEDURES  Procedure(s) performed:    Procedures    Medications  Tdap (BOOSTRIX) injection 0.5 mL (0.5 mLs Intramuscular Given 05/20/19 1859)  rabies immune globulin (HYPERAB/KEDRAB) injection 2,250 Units (2,250 Units Intramuscular Given 05/20/19 2157)  rabies vaccine (RABAVERT) injection 1 mL (1 mL Intramuscular Given 05/20/19 1903)     ____________________________________________   INITIAL IMPRESSION / ASSESSMENT AND PLAN / ED COURSE  Pertinent labs & imaging results that were available during my care of the patient were reviewed by me and considered in my medical decision making  (see chart for details).  Review of the [place] CSRS was performed in accordance of the [place] prior to dispensing any controlled drugs.           Patient's diagnosis is consistent with encounter for rabies vaccine.  Patient had exposure to a known rabid dog yesterday.  No appreciated bite injury.  Given close contact with known rabid animal patient will receive the rabies series including rabies immunoglobin, rabies vaccine and tetanus shot today.  Follow-up in 3 days for second rabies vaccine.  No prescriptions at this time..  Patient is given ED precautions to return to the ED for any worsening or new symptoms.     ____________________________________________  FINAL CLINICAL IMPRESSION(S) / ED DIAGNOSES  Final diagnoses:  Rabies exposure  Need for post exposure prophylaxis for rabies      NEW MEDICATIONS STARTED DURING THIS VISIT:  ED Discharge Orders    None  This chart was dictated using voice recognition software/Dragon. Despite best efforts to proofread, errors can occur which can change the meaning. Any change was purely unintentional.    Racheal Patches, PA-C 05/20/19 2254    Sharman Cheek, MD 05/20/19 2337

## 2019-05-20 NOTE — ED Notes (Signed)
Pt alert and sitting calmly in bed.  

## 2019-05-20 NOTE — ED Triage Notes (Signed)
Pt reports was called by animal control because he was exposed to a dog that tested positive for rabies. Pt was advised to come to the ED. Pt reports dpg did not break the skin on him

## 2019-05-23 ENCOUNTER — Encounter: Payer: Self-pay | Admitting: Emergency Medicine

## 2019-05-23 ENCOUNTER — Emergency Department
Admission: EM | Admit: 2019-05-23 | Discharge: 2019-05-23 | Disposition: A | Discharge status: Home / Self Care | Payer: 59 | Attending: Emergency Medicine | Admitting: Emergency Medicine

## 2019-05-23 ENCOUNTER — Other Ambulatory Visit: Payer: Self-pay

## 2019-05-23 DIAGNOSIS — Z2914 Encounter for prophylactic rabies immune globin: Secondary | ICD-10-CM | POA: Diagnosis present

## 2019-05-23 DIAGNOSIS — I10 Essential (primary) hypertension: Secondary | ICD-10-CM | POA: Diagnosis not present

## 2019-05-23 DIAGNOSIS — Z23 Encounter for immunization: Secondary | ICD-10-CM

## 2019-05-23 DIAGNOSIS — Z79899 Other long term (current) drug therapy: Secondary | ICD-10-CM | POA: Diagnosis not present

## 2019-05-23 MED ORDER — RABIES VACCINE, PCEC IM SUSR
1.0000 mL | Freq: Once | INTRAMUSCULAR | Status: AC
  Administered 2019-05-23: 1 mL via INTRAMUSCULAR
  Filled 2019-05-23: qty 1

## 2019-05-23 NOTE — ED Triage Notes (Signed)
Presents for 2nd rabies shot 

## 2019-05-23 NOTE — ED Provider Notes (Signed)
[facility] Emergency Department Provider Note  ____________________________________________   First MD Initiated Contact with Patient 05/23/19 1254     (approximate)  I have reviewed the triage vital signs and the nursing notes.   HISTORY  Chief Complaint Rabies Injection    HPI Jerome Arnold is a 53 y.o. male presents emergency department in need of his second rabies vaccine in his series.  No problems with the first set of vaccines.    Past Medical History:  Diagnosis Date  . GERD (gastroesophageal reflux disease)   . History of kidney stones    H/O  . Hypertension     There are no problems to display for this patient.   Past Surgical History:  Procedure Laterality Date  . HAND SURGERY Right    STABBED WITH KNIFE  . INGUINAL HERNIA REPAIR Left 07/20/2016   Procedure: HERNIA REPAIR INGUINAL ADULT;  Surgeon: Nadeen Landau, MD;  Location: [facility] ORS;  Service: General;  Laterality: Left;  . THROAT SURGERY     FELL ON GLASS  . TUMOR REMOVAL     AGE 58  . UMBILICAL HERNIA REPAIR N/A 12/23/2018   Procedure: OPEN HERNIA REPAIR UMBILICAL ADULT WITH MESH;  Surgeon: Sung Amabile, DO;  Location: [facility] ORS;  Service: General;  Laterality: N/A;    Prior to Admission medications   Medication Sig Start Date End Date Taking? Authorizing Provider  acyclovir (ZOVIRAX) 400 MG tablet Take 400 mg by mouth daily.    [provider]  cyclobenzaprine (FLEXERIL) 5 MG tablet Take 1-2 tablets (5-10 mg total) by mouth 3 (three) times daily as needed for muscle spasms. 01/20/18   Evon Slack, PA-C  doxazosin (CARDURA) 2 MG tablet Take 2 mg by mouth every morning.     [provider]  ibuprofen (ADVIL) 800 MG tablet Take 1 tablet (800 mg total) by mouth every 8 (eight) hours as needed for mild pain or moderate pain. 12/23/18   Tonna Boehringer, Isami, DO  loratadine (EQ ALLERGY RELIEF) 10 MG tablet Take 10 mg by mouth every morning.     [provider]  omeprazole (PRILOSEC) 20 MG capsule Take 20 mg by mouth every evening.    [provider]  telmisartan-hydrochlorothiazide (MICARDIS HCT) 80-12.5 MG tablet Take 1 tablet by mouth every morning.  09/25/17   [provider]    Allergies Guaifenesin & derivatives and Peanut-containing drug products  Family History  Problem Relation Age of Onset  . Brain cancer Mother   . Hypertension Father     Social History Social History   Tobacco Use  . Smoking status: Never Smoker  . Smokeless tobacco: Never Used  Substance Use Topics  . Alcohol use: Yes    Comment: BEER RARE  . Drug use: No    Review of Systems  Constitutional: No fever/chills Eyes: No visual changes. ENT: No sore throat. Respiratory: Denies cough Genitourinary: Negative for dysuria. Musculoskeletal: Negative for back pain. Skin: Negative for rash.    ____________________________________________   PHYSICAL EXAM:  VITAL SIGNS: ED Triage Vitals  Enc Vitals Group     BP --      Pulse --      Resp --      Temp --      Temp src --      SpO2 --      Weight 05/23/19 1255 250 lb (113.4 kg)     Height 05/23/19 1255 5\' 9"  (1.753 m)  Head Circumference --      Peak Flow --      Pain Score 05/23/19 1254 0     Pain Loc --      Pain Edu? --      Excl. in [place]? --     Constitutional: Alert and oriented. Well appearing and in no acute distress. Eyes: Conjunctivae are normal.  Head: Atraumatic. Nose: No congestion/rhinnorhea. Mouth/Throat: Mucous membranes are moist.   Neck:  supple no lymphadenopathy noted Cardiovascular: Normal rate, regular rhythm. Heart sounds are normal Respiratory: Normal respiratory effort.  No retractions, lungs c t a  GU: deferred Musculoskeletal: FROM all extremities, warm and well perfused Neurologic:  Normal speech and language.  Skin:  Skin is warm, dry and intact. No rash noted. Psychiatric: Mood and affect are normal. Speech and behavior are  normal.  ____________________________________________   LABS (all labs ordered are listed, but only abnormal results are displayed)  Labs Reviewed - No data to display ____________________________________________   ____________________________________________  RADIOLOGY    ____________________________________________   PROCEDURES  Procedure(s) performed: No  Procedures    ____________________________________________   INITIAL IMPRESSION / ASSESSMENT AND PLAN / ED COURSE  Pertinent labs & imaging results that were available during my care of the patient were reviewed by me and considered in my medical decision making (see chart for details).   Patient is a 53 year old male presents emergency department in need of a second rabies vaccine in his series.  No difficulty with his first round of vaccines.  Physical exam patient appears well.  Rabies vaccine given by nursing staff    [name] was evaluated in Emergency Department on 05/23/2019 for the symptoms described in the history of present illness. He was evaluated in the context of the global COVID-19 pandemic, which necessitated consideration that the patient might be at risk for infection with the SARS-CoV-2 virus that causes COVID-19. Institutional protocols and algorithms that pertain to the evaluation of patients at risk for COVID-19 are in a state of rapid change based on information released by regulatory bodies including the CDC and federal and state organizations. These policies and algorithms were followed during the patient's care in the ED.   As part of my medical decision making, I reviewed the following data within the [place] notes reviewed and incorporated, Old chart reviewed, Notes from prior ED visits and [place] Controlled Substance Database  ____________________________________________   FINAL CLINICAL IMPRESSION(S) / ED DIAGNOSES  Final diagnoses:  Encounter for  repeat administration of rabies vaccination      NEW MEDICATIONS STARTED DURING THIS VISIT:  New Prescriptions   No medications on file     Note:  This document was prepared using Dragon voice recognition software and may include unintentional dictation errors.    Versie Starks, PA-C 05/23/19 1429    Nance Pear, MD 05/23/19 [phone]

## 2019-05-27 ENCOUNTER — Other Ambulatory Visit: Payer: Self-pay

## 2019-05-27 ENCOUNTER — Emergency Department
Admission: EM | Admit: 2019-05-27 | Discharge: 2019-05-27 | Disposition: A | Discharge status: Home / Self Care | Payer: 59 | Attending: Emergency Medicine | Admitting: Emergency Medicine

## 2019-05-27 ENCOUNTER — Encounter: Payer: Self-pay | Admitting: Emergency Medicine

## 2019-05-27 DIAGNOSIS — I10 Essential (primary) hypertension: Secondary | ICD-10-CM | POA: Insufficient documentation

## 2019-05-27 DIAGNOSIS — Z23 Encounter for immunization: Secondary | ICD-10-CM | POA: Insufficient documentation

## 2019-05-27 DIAGNOSIS — Z79899 Other long term (current) drug therapy: Secondary | ICD-10-CM | POA: Insufficient documentation

## 2019-05-27 DIAGNOSIS — Z203 Contact with and (suspected) exposure to rabies: Secondary | ICD-10-CM | POA: Diagnosis present

## 2019-05-27 DIAGNOSIS — Z91010 Allergy to peanuts: Secondary | ICD-10-CM | POA: Diagnosis not present

## 2019-05-27 MED ORDER — RABIES VACCINE, PCEC IM SUSR
1.0000 mL | Freq: Once | INTRAMUSCULAR | Status: AC
  Administered 2019-05-27: 1 mL via INTRAMUSCULAR
  Filled 2019-05-27: qty 1

## 2019-05-27 NOTE — ED Provider Notes (Signed)
[facility] Emergency Department Provider Note  ____________________________________________  Time seen: Approximately 10:54 PM  I have reviewed the triage vital signs and the nursing notes.   HISTORY  Chief Complaint Rabies shot   HPI AMAAR OSHITA is a 53 y.o. male presents for the 3rd rabies injection. No complication from previous injections or new concerns today.  Past Medical History:  Diagnosis Date  . GERD (gastroesophageal reflux disease)   . History of kidney stones    H/O  . Hypertension     There are no problems to display for this patient.   Past Surgical History:  Procedure Laterality Date  . HAND SURGERY Right    STABBED WITH KNIFE  . INGUINAL HERNIA REPAIR Left 07/20/2016   Procedure: HERNIA REPAIR INGUINAL ADULT;  Surgeon: Nadeen Landau, MD;  Location: [facility] ORS;  Service: General;  Laterality: Left;  . THROAT SURGERY     FELL ON GLASS  . TUMOR REMOVAL     AGE 59  . UMBILICAL HERNIA REPAIR N/A 12/23/2018   Procedure: OPEN HERNIA REPAIR UMBILICAL ADULT WITH MESH;  Surgeon: Sung Amabile, DO;  Location: [facility] ORS;  Service: General;  Laterality: N/A;    Prior to Admission medications   Medication Sig Start Date End Date Taking? Authorizing Provider  acyclovir (ZOVIRAX) 400 MG tablet Take 400 mg by mouth daily.    [provider]  cyclobenzaprine (FLEXERIL) 5 MG tablet Take 1-2 tablets (5-10 mg total) by mouth 3 (three) times daily as needed for muscle spasms. 01/20/18   Evon Slack, PA-C  doxazosin (CARDURA) 2 MG tablet Take 2 mg by mouth every morning.     [provider]  ibuprofen (ADVIL) 800 MG tablet Take 1 tablet (800 mg total) by mouth every 8 (eight) hours as needed for mild pain or moderate pain. 12/23/18   Tonna Boehringer, Isami, DO  loratadine (EQ ALLERGY RELIEF) 10 MG tablet Take 10 mg by mouth every morning.     [provider]  omeprazole (PRILOSEC) 20 MG capsule Take 20 mg by mouth every  evening.    [provider]  telmisartan-hydrochlorothiazide (MICARDIS HCT) 80-12.5 MG tablet Take 1 tablet by mouth every morning.  09/25/17   [provider]    Allergies Guaifenesin & derivatives and Peanut-containing drug products  Family History  Problem Relation Age of Onset  . Brain cancer Mother   . Hypertension Father     Social History Social History   Tobacco Use  . Smoking status: Never Smoker  . Smokeless tobacco: Never Used  Substance Use Topics  . Alcohol use: Yes    Comment: BEER RARE  . Drug use: No    Review of Systems Constitutional: No fever/chills Eyes: No visual changes. Respiratory: Denies shortness of breath. Musculoskeletal: Negative for pain. Skin: Negative for concern of infection. Neurological: Negative for headaches, focal weakness or numbness. ____________________________________________   PHYSICAL EXAM:  VITAL SIGNS: ED Triage Vitals  Enc Vitals Group     BP 05/27/19 1734 (!) 141/103     Pulse Rate 05/27/19 1734 64     Resp 05/27/19 1734 16     Temp 05/27/19 1734 97.9 F (36.6 C)     Temp Source 05/27/19 1734 Oral     SpO2 05/27/19 1734 97 %     Weight --      Height 05/27/19 1731 5\' 9"  (1.753 m)     Head Circumference --      Peak Flow --  Pain Score 05/27/19 1731 0     Pain Loc --      Pain Edu? --      Excl. in [place]? --     Constitutional: Alert and oriented. Well appearing and in no acute distress. Eyes: Conjunctivae are normal. Head: Atraumatic. Nose: No congestion/rhinnorhea. Mouth/Throat: Mucous membranes are moist. Neck: No stridor.  Cardiovascular: Normal rate, regular rhythm.  Respiratory: Normal respiratory effort.  No retractions.. Gastrointestinal: Soft and nontender. No distention Musculoskeletal: No lower extremity tenderness nor edema. Neurologic:  Normal speech and language. No gross focal neurologic deficits are appreciated. Speech is normal. No gait instability. Skin:  Skin is warm,  dry and intact.  Psychiatric: Mood and affect are normal. Speech and behavior are normal.  ____________________________________________   LABS (all labs ordered are listed, but only abnormal results are displayed)  Labs Reviewed - No data to display ____________________________________________   RADIOLOGY  Not indicated. ____________________________________________   PROCEDURES  Procedure(s) performed: Not indicated.  ____________________________________________   INITIAL IMPRESSION / ASSESSMENT AND PLAN / ED COURSE    53 year old male presenting to the emergency department for third rabies vaccination.  Injection given and patient was discharged home.  He will return for the final dose as advised.  Pertinent labs & imaging results that were available during my care of the patient were reviewed by me and considered in my medical decision making (see chart for details).   ____________________________________________   FINAL CLINICAL IMPRESSION(S) / ED DIAGNOSES  Final diagnoses:  Encounter for repeat administration of rabies vaccination    Note:  This document was prepared using Dragon voice recognition software and may include unintentional dictation errors.    Victorino Dike, FNP 05/27/19 2257    Harvest Dark, MD 05/28/19 1527

## 2019-05-27 NOTE — ED Triage Notes (Signed)
Here for 3rd rabies shot

## 2019-05-27 NOTE — ED Notes (Signed)
Pt seen  In [facility].   Treated by [facility].  No scanner available for meds.  Discharged from [facility].

## 2019-06-03 ENCOUNTER — Emergency Department
Admission: EM | Admit: 2019-06-03 | Discharge: 2019-06-03 | Disposition: A | Discharge status: Home / Self Care | Payer: 59 | Attending: Emergency Medicine | Admitting: Emergency Medicine

## 2019-06-03 ENCOUNTER — Encounter: Payer: Self-pay | Admitting: Emergency Medicine

## 2019-06-03 ENCOUNTER — Other Ambulatory Visit: Payer: Self-pay

## 2019-06-03 DIAGNOSIS — I10 Essential (primary) hypertension: Secondary | ICD-10-CM | POA: Diagnosis not present

## 2019-06-03 DIAGNOSIS — Z23 Encounter for immunization: Secondary | ICD-10-CM

## 2019-06-03 DIAGNOSIS — Z79899 Other long term (current) drug therapy: Secondary | ICD-10-CM | POA: Insufficient documentation

## 2019-06-03 DIAGNOSIS — Z91010 Allergy to peanuts: Secondary | ICD-10-CM | POA: Insufficient documentation

## 2019-06-03 MED ORDER — RABIES VACCINE, PCEC IM SUSR
1.0000 mL | Freq: Once | INTRAMUSCULAR | Status: AC
  Administered 2019-06-03: 11:00:00 1 mL via INTRAMUSCULAR
  Filled 2019-06-03: qty 1

## 2019-06-03 NOTE — ED Triage Notes (Signed)
Pt here for last rabies injection 

## 2019-06-03 NOTE — ED Provider Notes (Signed)
[facility] Emergency Department Provider Note  ____________________________________________  Time seen: Approximately 10:28 AM  I have reviewed the triage vital signs and the nursing notes.   HISTORY  Chief Complaint Rabies Injection    HPI Jerome Arnold is a 54 y.o. male that presents to the emergency department for final rabies vaccination.  Patient has not had any side effects from previous vaccinations.  He has no questions or concerns today.   Past Medical History:  Diagnosis Date  . GERD (gastroesophageal reflux disease)   . History of kidney stones    H/O  . Hypertension     There are no problems to display for this patient.   Past Surgical History:  Procedure Laterality Date  . HAND SURGERY Right    STABBED WITH KNIFE  . INGUINAL HERNIA REPAIR Left 07/20/2016   Procedure: HERNIA REPAIR INGUINAL ADULT;  Surgeon: Nadeen Landau, MD;  Location: [facility] ORS;  Service: General;  Laterality: Left;  . THROAT SURGERY     FELL ON GLASS  . TUMOR REMOVAL     AGE 62  . UMBILICAL HERNIA REPAIR N/A 12/23/2018   Procedure: OPEN HERNIA REPAIR UMBILICAL ADULT WITH MESH;  Surgeon: Sung Amabile, DO;  Location: [facility] ORS;  Service: General;  Laterality: N/A;    Prior to Admission medications   Medication Sig Start Date End Date Taking? Authorizing Provider  acyclovir (ZOVIRAX) 400 MG tablet Take 400 mg by mouth daily.    [provider]  cyclobenzaprine (FLEXERIL) 5 MG tablet Take 1-2 tablets (5-10 mg total) by mouth 3 (three) times daily as needed for muscle spasms. 01/20/18   Evon Slack, PA-C  doxazosin (CARDURA) 2 MG tablet Take 2 mg by mouth every morning.     [provider]  ibuprofen (ADVIL) 800 MG tablet Take 1 tablet (800 mg total) by mouth every 8 (eight) hours as needed for mild pain or moderate pain. 12/23/18   Tonna Boehringer, Isami, DO  loratadine (EQ ALLERGY RELIEF) 10 MG tablet Take 10 mg by mouth every morning.     [provider]  omeprazole (PRILOSEC) 20 MG capsule Take 20 mg by mouth every evening.    [provider]  telmisartan-hydrochlorothiazide (MICARDIS HCT) 80-12.5 MG tablet Take 1 tablet by mouth every morning.  09/25/17   [provider]    Allergies Guaifenesin & derivatives and Peanut-containing drug products  Family History  Problem Relation Age of Onset  . Brain cancer Mother   . Hypertension Father     Social History Social History   Tobacco Use  . Smoking status: Never Smoker  . Smokeless tobacco: Never Used  Substance Use Topics  . Alcohol use: Yes    Comment: BEER RARE  . Drug use: No     Review of Systems  Cardiovascular: No chest pain. Respiratory: No SOB. Gastrointestinal: No nausea, no vomiting.  Musculoskeletal: Negative for musculoskeletal pain. Skin: Negative for rash, abrasions, lacerations, ecchymosis.  ____________________________________________   PHYSICAL EXAM:  VITAL SIGNS: ED Triage Vitals  Enc Vitals Group     BP 06/03/19 0912 (!) 161/93     Pulse Rate 06/03/19 0912 79     Resp 06/03/19 0912 18     Temp 06/03/19 0912 98.5 F (36.9 C)     Temp Source 06/03/19 0912 Oral     SpO2 06/03/19 0912 97 %     Weight 06/03/19 0908 250 lb (113.4 kg)     Height 06/03/19 0908 5\' 9"  (  1.753 m)     Head Circumference --      Peak Flow --      Pain Score 06/03/19 0908 0     Pain Loc --      Pain Edu? --      Excl. in [place]? --      Constitutional: Alert and oriented. Well appearing and in no acute distress. Eyes: Conjunctivae are normal. PERRL. EOMI. Head: Atraumatic. ENT:      Ears:      Nose: No congestion/rhinnorhea.      Mouth/Throat: Mucous membranes are moist.  Neck: No stridor.  Cardiovascular: Good peripheral circulation. Respiratory: Normal respiratory effort without tachypnea or retractions.  Musculoskeletal: Full range of motion to all extremities. No gross deformities appreciated. Neurologic:  Normal speech and  language. No gross focal neurologic deficits are appreciated.  Skin:  Skin is warm, dry and intact. No rash noted. Psychiatric: Mood and affect are normal. Speech and behavior are normal. Patient exhibits appropriate insight and judgement.   ____________________________________________   LABS (all labs ordered are listed, but only abnormal results are displayed)  Labs Reviewed - No data to display ____________________________________________  EKG   ____________________________________________  RADIOLOGY   No results found.  ____________________________________________    PROCEDURES  Procedure(s) performed:    Procedures    Medications  rabies vaccine (RABAVERT) injection 1 mL (1 mL Intramuscular Given 06/03/19 1041)     ____________________________________________   INITIAL IMPRESSION / ASSESSMENT AND PLAN / ED COURSE  Pertinent labs & imaging results that were available during my care of the patient were reviewed by me and considered in my medical decision making (see chart for details).  Review of the [place] CSRS was performed in accordance of the [place] prior to dispensing any controlled drugs.   Patient presented to the emergency department for final rabies vaccination.  Vital signs and exam are reassuring.  Rabies vaccination was given in the emergency department.  Patient is to follow up with primary care as directed. Patient is given ED precautions to return to the ED for any worsening or new symptoms.  Jerome Arnold was evaluated in Emergency Department on 06/03/2019 for the symptoms described in the history of present illness. He was evaluated in the context of the global COVID-19 pandemic, which necessitated consideration that the patient might be at risk for infection with the SARS-CoV-2 virus that causes COVID-19. Institutional protocols and algorithms that pertain to the evaluation of patients at risk for COVID-19 are in a state of rapid change based on  information released by regulatory bodies including the CDC and federal and state organizations. These policies and algorithms were followed during the patient's care in the ED.   ____________________________________________  FINAL CLINICAL IMPRESSION(S) / ED DIAGNOSES  Final diagnoses:  Encounter for repeat administration of rabies vaccination      NEW MEDICATIONS STARTED DURING THIS VISIT:  ED Discharge Orders    None          This chart was dictated using voice recognition software/Dragon. Despite best efforts to proofread, errors can occur which can change the meaning. Any change was purely unintentional.    Laban Emperor, PA-C 06/03/19 1135    Earleen Newport, MD 06/03/19 1304

## 2019-10-16 ENCOUNTER — Ambulatory Visit: Payer: 59 | Attending: Internal Medicine

## 2019-10-16 DIAGNOSIS — Z23 Encounter for immunization: Secondary | ICD-10-CM

## 2019-10-16 NOTE — Progress Notes (Signed)
   Covid-19 Vaccination Clinic  Name:  Jerome Arnold    MRN: [id number] DOB: Aug 24, 1965  10/16/2019  Mr. Jerome Arnold was observed post Covid-19 immunization for 30 minutes based on pre-vaccination screening without incident. He was provided with Vaccine Information Sheet and instruction to access the V-Safe system.   Mr. Jerome Arnold was instructed to call 911 with any severe reactions post vaccine: Marland Kitchen Difficulty breathing  . Swelling of face and throat  . A fast heartbeat  . A bad rash all over body  . Dizziness and weakness   Immunizations Administered    Name Date Dose VIS Date Route   Pfizer COVID-19 Vaccine 10/16/2019  9:07 AM 0.3 mL 07/22/2018 Intramuscular   Manufacturer: [employer], [employer]   Lot: [id number]   NDC: [address]-7

## 2019-11-10 ENCOUNTER — Ambulatory Visit: Payer: 59 | Attending: Internal Medicine

## 2019-11-10 DIAGNOSIS — Z23 Encounter for immunization: Secondary | ICD-10-CM

## 2019-11-10 NOTE — Progress Notes (Signed)
   Covid-19 Vaccination Clinic  Name:  FRAN MCREE    MRN: [id number] DOB: [birth year]  11/10/2019  Mr. Simonetti was observed post Covid-19 immunization for 15 minutes without incident. He was provided with Vaccine Information Sheet and instruction to access the V-Safe system.   Mr. Harold was instructed to call 911 with any severe reactions post vaccine: Marland Kitchen Difficulty breathing  . Swelling of face and throat  . A fast heartbeat  . A bad rash all over body  . Dizziness and weakness   Immunizations Administered    Name Date Dose VIS Date Route   Pfizer COVID-19 Vaccine 11/10/2019  9:10 AM 0.3 mL 07/22/2018 Intramuscular   Manufacturer: [employer], [employer]   Lot: [id number]   NDC: [address]-3

## 2022-03-21 ENCOUNTER — Ambulatory Visit (INDEPENDENT_AMBULATORY_CARE_PROVIDER_SITE_OTHER): Payer: 59 | Admitting: Urology

## 2022-03-21 ENCOUNTER — Encounter: Payer: Self-pay | Admitting: Urology

## 2022-03-21 VITALS — BP 171/105 | HR 80 | Ht 69.0 in | Wt 260.0 lb

## 2022-03-21 DIAGNOSIS — N433 Hydrocele, unspecified: Secondary | ICD-10-CM | POA: Diagnosis not present

## 2022-03-21 LAB — URINALYSIS, COMPLETE
Bilirubin, UA: NEGATIVE
Glucose, UA: NEGATIVE
Ketones, UA: NEGATIVE
Nitrite, UA: NEGATIVE
Protein,UA: NEGATIVE
RBC, UA: NEGATIVE
Specific Gravity, UA: 1.02 (ref 1.005–1.030)
Urobilinogen, Ur: 0.2 mg/dL (ref 0.2–1.0)
pH, UA: 6 (ref 5.0–7.5)

## 2022-03-21 LAB — MICROSCOPIC EXAMINATION

## 2022-03-21 NOTE — Progress Notes (Signed)
03/21/2022 10:26 AM   Jerome Arnold [birth year] [id number]  Referring provider: Danella Penton, MD [phone] [facility] [address] [facility] [place],  [place] [address]  Chief Complaint  Patient presents with   Hydrocele    HPI: Jerome Arnold is a 56 y.o. male referred for evaluation of a left hydrocele.  Noted left hemiscrotal swelling ~ 1 month ago Scrotal ultrasound remarkable for large left hydrocele Was having mild irritation but presently asymptomatic.  Feels hydrocele slightly decreased in size Left inguinal hernia repair 2018  + Erectile dysfunction Organic risk factors hypertension and antihypertensive medication Using tadalafil 20 mg as needed which has been effective Inquiring if hydrocele could be an etiology of his ED   PMH: Past Medical History:  Diagnosis Date   GERD (gastroesophageal reflux disease)    History of kidney stones    H/O   Hypertension     Surgical History: Past Surgical History:  Procedure Laterality Date   HAND SURGERY Right    STABBED WITH KNIFE   INGUINAL HERNIA REPAIR Left 07/20/2016   Procedure: HERNIA REPAIR INGUINAL ADULT;  Surgeon: Nadeen Landau, MD;  Location: [facility] ORS;  Service: General;  Laterality: Left;   THROAT SURGERY     FELL ON GLASS   TUMOR REMOVAL     AGE 89   UMBILICAL HERNIA REPAIR N/A 12/23/2018   Procedure: OPEN HERNIA REPAIR UMBILICAL ADULT WITH MESH;  Surgeon: Sung Amabile, DO;  Location: [facility] ORS;  Service: General;  Laterality: N/A;    Home Medications:  Allergies as of 03/21/2022       Reactions   Guaifenesin & Derivatives Anaphylaxis   Peanut-containing Drug Products Anaphylaxis        Medication List        Accurate as of March 21, 2022 10:26 AM. If you have any questions, ask your nurse or doctor.          STOP taking these medications    cyclobenzaprine 5 MG tablet Commonly known as: FLEXERIL Stopped by: Riki Altes, MD   ibuprofen 800 MG  tablet Commonly known as: ADVIL Stopped by: Riki Altes, MD       TAKE these medications    acyclovir 400 MG tablet Commonly known as: ZOVIRAX Take 400 mg by mouth daily.   doxazosin 2 MG tablet Commonly known as: CARDURA Take 2 mg by mouth every morning.   EQ Allergy Relief 10 MG tablet Generic drug: loratadine Take 10 mg by mouth every morning.   omeprazole 20 MG capsule Commonly known as: PRILOSEC Take 20 mg by mouth every evening.   telmisartan-hydrochlorothiazide 80-12.5 MG tablet Commonly known as: MICARDIS HCT Take 1 tablet by mouth every morning.        Allergies:  Allergies  Allergen Reactions   Guaifenesin & Derivatives Anaphylaxis   Peanut-Containing Drug Products Anaphylaxis    Family History: Family History  Problem Relation Age of Onset   Brain cancer Mother    Hypertension Father     Social History:  reports that he has never smoked. He has never used smokeless tobacco. He reports current alcohol use. He reports that he does not use drugs.   Physical Exam: BP (!) 171/105   Pulse 80   Ht 5\' 9"  (1.753 m)   Wt 260 lb (117.9 kg)   BMI 38.40 kg/m   Constitutional:  Alert and oriented, No acute distress. HEENT: [place] AT Respiratory: Normal respiratory effort, no increased work of breathing. GU:  Moderate-large left hydrocele.  No palpable hernia.  Right testis palpably normal Psychiatric: Normal mood and affect.   Pertinent Imaging: Scrotal ultrasound was performed at [facility] and images not available for review   Assessment & Plan:    1.  Right hydrocele Management options were discussed including observation, hydrocelectomy and aspiration He is presently asymptomatic and would recommend observation Follow-up as needed for increased size or bothersome symptoms   Abbie Sons, MD  [place] [address], [place], [place] [address] [phone]

## 2022-09-16 ENCOUNTER — Ambulatory Visit
Admission: EM | Admit: 2022-09-16 | Discharge: 2022-09-16 | Disposition: A | Discharge status: Home / Self Care | Payer: 59 | Attending: Family Medicine | Admitting: Family Medicine

## 2022-09-16 DIAGNOSIS — R109 Unspecified abdominal pain: Secondary | ICD-10-CM | POA: Diagnosis present

## 2022-09-16 DIAGNOSIS — R3129 Other microscopic hematuria: Secondary | ICD-10-CM | POA: Insufficient documentation

## 2022-09-16 LAB — URINALYSIS, W/ REFLEX TO CULTURE (INFECTION SUSPECTED)
Bilirubin Urine: NEGATIVE
Glucose, UA: NEGATIVE mg/dL
Ketones, ur: NEGATIVE mg/dL
Nitrite: NEGATIVE
Protein, ur: NEGATIVE mg/dL
Specific Gravity, Urine: 1.025 (ref 1.005–1.030)
pH: 5.5 (ref 5.0–8.0)

## 2022-09-16 MED ORDER — TAMSULOSIN HCL 0.4 MG PO CAPS: 0.4000 mg | ORAL_CAPSULE | Freq: Every day | ORAL | 0 refills | Status: DC

## 2022-09-16 MED ORDER — IBUPROFEN 800 MG PO TABS: 800.0000 mg | ORAL_TABLET | Freq: Three times a day (TID) | ORAL | 0 refills | Status: AC

## 2022-09-16 NOTE — Discharge Instructions (Addendum)
Call your urologist office tomorrow.  If pain gets significantly worse, go to the emergency department for imaging of your abdomen to be sure that you do not have a stone blocking your urinary flow.

## 2022-09-16 NOTE — ED Triage Notes (Signed)
Patient states he thinks he pulled a muscle on Monday, pain in left side that comes and goes. Tx with Ibuprofen.

## 2022-09-16 NOTE — ED Provider Notes (Addendum)
[facility]    CSN: [id number] Arrival date & time: 09/16/22  1335      History   Chief Complaint No chief complaint on file.   HPI  HPI Jerome Arnold is a 57 y.o. male.   Jerome Arnold presents for left flank pain that started on Tuesday. Has history of kidney stones. No hematuria. He was moving on Monday was reaching to get something and felt a pulling sensation. Has been taking ibuprofen without relief.  Denies leg pain, leg weakness, new numbness, new weakness, new tingling, abdominal pain, chest pain, incontinence, perianal numbness. Continues to have  pain with movement.     Past Medical History:  Diagnosis Date   GERD (gastroesophageal reflux disease)    History of kidney stones    H/O   Hypertension     There are no problems to display for this patient.   Past Surgical History:  Procedure Laterality Date   HAND SURGERY Right    STABBED WITH KNIFE   INGUINAL HERNIA REPAIR Left 07/20/2016   Procedure: HERNIA REPAIR INGUINAL ADULT;  Surgeon: Nadeen Landau, MD;  Location: [facility] ORS;  Service: General;  Laterality: Left;   THROAT SURGERY     FELL ON GLASS   TUMOR REMOVAL     AGE 20   UMBILICAL HERNIA REPAIR N/A 12/23/2018   Procedure: OPEN HERNIA REPAIR UMBILICAL ADULT WITH MESH;  Surgeon: Sung Amabile, DO;  Location: [facility] ORS;  Service: General;  Laterality: N/A;       Home Medications    Prior to Admission medications   Medication Sig Start Date End Date Taking? Authorizing Provider  ibuprofen (ADVIL) 800 MG tablet Take 1 tablet (800 mg total) by mouth 3 (three) times daily. 09/16/22  Yes Shayana Hornstein, DO  acyclovir (ZOVIRAX) 400 MG tablet Take 400 mg by mouth daily.    [provider]  cyclobenzaprine (FLEXERIL) 10 MG tablet Take by mouth. 09/11/22 11/10/22  [provider]  loratadine (EQ ALLERGY RELIEF) 10 MG tablet Take 10 mg by mouth every morning.     [provider]  montelukast (SINGULAIR) 10 MG tablet Take 10  mg by mouth daily.    [provider]  omeprazole (PRILOSEC) 20 MG capsule Take 20 mg by mouth every evening.    [provider]  telmisartan-hydrochlorothiazide (MICARDIS HCT) 80-12.5 MG tablet Take 1 tablet by mouth every morning.  09/25/17   [provider]    Family History Family History  Problem Relation Age of Onset   Brain cancer Mother    Hypertension Father     Social History Social History   Tobacco Use   Smoking status: Never   Smokeless tobacco: Never  Vaping Use   Vaping Use: Never used  Substance Use Topics   Alcohol use: Yes    Comment: BEER RARE   Drug use: No     Allergies   Guaifenesin & derivatives and Peanut-containing drug products   Review of Systems Review of Systems: egative unless otherwise stated in HPI.      Physical Exam Triage Vital Signs ED Triage Vitals  Enc Vitals Group     BP 09/16/22 1410 (!) 153/104     Pulse Rate 09/16/22 1410 81     Resp 09/16/22 1410 20     Temp 09/16/22 1410 98.6 F (37 C)     Temp Source 09/16/22 1410 Oral     SpO2 09/16/22 1410 93 %     Weight 09/16/22 1407  261 lb (118.4 kg)     Height 09/16/22 1407 5\' 9"  (1.753 m)     Head Circumference --      Peak Flow --      Pain Score 09/16/22 1407 4     Pain Loc --      Pain Edu? --      Excl. in [place]? --    No data found.  Updated Vital Signs BP (!) 153/104 (BP Location: Right Arm)   Pulse 81   Temp 98.6 F (37 C) (Oral)   Resp 20   Ht 5\' 9"  (1.753 m)   Wt 118.4 kg   SpO2 93%   BMI 38.54 kg/m   Visual Acuity Right Eye Distance:   Left Eye Distance:   Bilateral Distance:    Right Eye Near:   Left Eye Near:    Bilateral Near:     Physical Exam GEN: well appearing male in no acute distress  CVS: well perfused  RESP: speaking in full sentences without pause, no respiratory distress  MSK:  Lumbar spine: - Inspection: no gross deformity or asymmetry, swelling or ecchymosis. No skin changes  - Palpation: No TTP over  the spinous processes, left lumbar paraspinal muscles, no SI joint tenderness bilaterally - ROM: full active ROM of the lumbar spine  - Strength: 5/5 strength of lower extremity  - Neuro: sensation intact in the L4-S1 nerve root distribution b/l - Special testing: Negative straight leg raise SKIN: warm, dry, no overly skin rash or erythema    UC Treatments / Results  Labs (all labs ordered are listed, but only abnormal results are displayed) Labs Reviewed  URINALYSIS, W/ REFLEX TO CULTURE (INFECTION SUSPECTED) - Abnormal; Notable for the following components:      Result Value   Hgb urine dipstick SMALL (*)    Leukocytes,Ua TRACE (*)    Bacteria, UA RARE (*)    All other components within normal limits    EKG   Radiology No results found.   Procedures Procedures (including critical care time)  Medications Ordered in UC Medications - No data to display  Initial Impression / Assessment and Plan / UC Course  I have reviewed the triage vital signs and the nursing notes.  Pertinent labs & imaging results that were available during my care of the patient were reviewed by me and considered in my medical decision making (see chart for details).      Pt is a 57 y.o.  male with history of kidney stones presents for left flank pain. Exam with left flank tenderness. Imaging deferred. UA with hematuria supported on microscopy.  Given his history of kidney stones, I suspect he may be having another one. Recommended ED evaluation however pt would like to hold off as he has an upcoming urology appointment. Start Flomax and ibuprofen 800 mg for pain.  Pain may be musculoskeletal in origin. He has Flexeril at home and will take this. Patient to gradually return to normal activities, as tolerated and continue ordinary activities within the limits permitted by pain. Patient to follow up with his urologist as scheduled. Go to the ED, if symptoms acutely worsens.     Discussed MDM, treatment  plan and plan for follow-up with patient who agrees with plan.   Final Clinical Impressions(s) / UC Diagnoses   Final diagnoses:  Acute left flank pain  Microscopic hematuria     Discharge Instructions      Call your urologist office tomorrow.  If pain  gets significantly worse, go to the emergency department for imaging of your abdomen to be sure that you do not have a stone blocking your urinary flow.     ED Prescriptions     Medication Sig Dispense Auth. Provider   ibuprofen (ADVIL) 800 MG tablet Take 1 tablet (800 mg total) by mouth 3 (three) times daily. 30 tablet Samaya Boardley, DO   tamsulosin (FLOMAX) 0.4 MG CAPS capsule Take 1 capsule (0.4 mg total) by mouth daily. 30 capsule Katha Cabal, DO      PDMP not reviewed this encounter.      Katha Cabal, DO 10/03/22 2022

## 2022-09-21 ENCOUNTER — Ambulatory Visit: Payer: 59 | Admitting: Physician Assistant

## 2022-09-21 VITALS — BP 156/94 | HR 80 | Ht 69.0 in | Wt 261.0 lb

## 2022-09-21 DIAGNOSIS — R109 Unspecified abdominal pain: Secondary | ICD-10-CM | POA: Diagnosis not present

## 2022-09-21 LAB — URINALYSIS, COMPLETE
Bilirubin, UA: NEGATIVE
Glucose, UA: NEGATIVE
Ketones, UA: NEGATIVE
Nitrite, UA: NEGATIVE
RBC, UA: NEGATIVE
Specific Gravity, UA: 1.02 (ref 1.005–1.030)
Urobilinogen, Ur: 0.2 mg/dL (ref 0.2–1.0)
pH, UA: 6 (ref 5.0–7.5)

## 2022-09-21 LAB — MICROSCOPIC EXAMINATION

## 2022-09-21 NOTE — Progress Notes (Signed)
Jerome Arnold presents for an office/procedure visit. BP today is 171/98_. He/She is complaint/noncompliant with BP medication. Greater than 140/90. Provider  notified. Pt advised to follow up with PCP___. Pt voiced understanding.
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
## 2022-09-30 LAB — CALCULI, WITH PHOTOGRAPH (CLINICAL LAB)
Calcium Oxalate Dihydrate: 20 %
Calcium Oxalate Monohydrate: 80 %
Weight Calculi: 66 mg
